# Patient Record
Sex: Female | Born: 1975 | Race: Black or African American | Hispanic: No | Marital: Single | Smoking: Never smoker
Health system: Southern US, Community
[De-identification: ages and names within clinical notes are randomized; demographics above are authoritative.]

## PROBLEM LIST (undated history)

## (undated) DIAGNOSIS — K5792 Diverticulitis of intestine, part unspecified, without perforation or abscess without bleeding: Secondary | ICD-10-CM

## (undated) HISTORY — DX: Diverticulitis of intestine, part unspecified, without perforation or abscess without bleeding: K57.92

---

## 1999-10-18 ENCOUNTER — Other Ambulatory Visit: Admission: RE | Admit: 1999-10-18 | Discharge: 1999-10-18 | Payer: Self-pay | Admitting: Internal Medicine

## 1999-12-24 HISTORY — PX: FOOT SURGERY: SHX648

## 2002-01-06 ENCOUNTER — Other Ambulatory Visit: Admission: RE | Admit: 2002-01-06 | Discharge: 2002-01-06 | Payer: Self-pay | Admitting: Obstetrics and Gynecology

## 2003-09-08 ENCOUNTER — Other Ambulatory Visit: Admission: RE | Admit: 2003-09-08 | Discharge: 2003-09-08 | Payer: Self-pay | Admitting: Obstetrics and Gynecology

## 2007-04-01 ENCOUNTER — Emergency Department (HOSPITAL_COMMUNITY): Admission: EM | Admit: 2007-04-01 | Discharge: 2007-04-02 | Payer: Self-pay | Admitting: Emergency Medicine

## 2007-07-22 ENCOUNTER — Encounter: Admission: RE | Admit: 2007-07-22 | Discharge: 2007-07-22 | Payer: Self-pay | Admitting: General Surgery

## 2007-08-03 ENCOUNTER — Encounter: Admission: RE | Admit: 2007-08-03 | Discharge: 2007-08-03 | Payer: Self-pay | Admitting: Internal Medicine

## 2007-10-10 ENCOUNTER — Emergency Department (HOSPITAL_COMMUNITY): Admission: EM | Admit: 2007-10-10 | Discharge: 2007-10-11 | Payer: Self-pay | Admitting: Emergency Medicine

## 2010-12-04 ENCOUNTER — Emergency Department (HOSPITAL_COMMUNITY)
Admission: EM | Admit: 2010-12-04 | Discharge: 2010-12-05 | Payer: Self-pay | Source: Home / Self Care | Admitting: Emergency Medicine

## 2011-01-10 ENCOUNTER — Other Ambulatory Visit (HOSPITAL_COMMUNITY): Payer: Self-pay | Admitting: General Surgery

## 2011-01-10 DIAGNOSIS — K5792 Diverticulitis of intestine, part unspecified, without perforation or abscess without bleeding: Secondary | ICD-10-CM

## 2011-01-11 ENCOUNTER — Inpatient Hospital Stay (HOSPITAL_COMMUNITY): Admission: RE | Admit: 2011-01-11 | Payer: Self-pay | Source: Ambulatory Visit

## 2011-01-13 ENCOUNTER — Encounter: Payer: Self-pay | Admitting: Internal Medicine

## 2011-02-07 ENCOUNTER — Other Ambulatory Visit (HOSPITAL_COMMUNITY): Payer: Self-pay

## 2011-03-05 LAB — URINALYSIS, ROUTINE W REFLEX MICROSCOPIC
Nitrite: NEGATIVE
Protein, ur: NEGATIVE mg/dL
Specific Gravity, Urine: 1.022 (ref 1.005–1.030)
Urobilinogen, UA: 0.2 mg/dL (ref 0.0–1.0)

## 2011-03-05 LAB — CBC
HCT: 37.4 % (ref 36.0–46.0)
Hemoglobin: 12.4 g/dL (ref 12.0–15.0)
MCH: 27.1 pg (ref 26.0–34.0)
MCHC: 33.2 g/dL (ref 30.0–36.0)
RBC: 4.58 MIL/uL (ref 3.87–5.11)

## 2011-03-05 LAB — URINE MICROSCOPIC-ADD ON

## 2011-03-05 LAB — CLOSTRIDIUM DIFFICILE BY PCR

## 2011-03-05 LAB — DIFFERENTIAL
Basophils Relative: 0 % (ref 0–1)
Eosinophils Absolute: 0.2 10*3/uL (ref 0.0–0.7)
Lymphs Abs: 2.4 10*3/uL (ref 0.7–4.0)
Monocytes Absolute: 0.4 10*3/uL (ref 0.1–1.0)
Monocytes Relative: 6 % (ref 3–12)
Neutrophils Relative %: 59 % (ref 43–77)

## 2011-03-24 HISTORY — PX: DILATION AND CURETTAGE OF UTERUS: SHX78

## 2011-05-14 NOTE — H&P (Signed)
NAMERASHADA, KLONTZ NO.:  [id number]  MEDICAL RECORD NO.:  [id number]           Savage TYPE:  O  LOCATION:  [facility]                           F[facility]ILITY:  [facility]  PHYSICIAN:  Osborn Coho, M.D.   DATE OF BIRTH:  [birth year]  DATE OF ADMISSION:  05/06/2011 DATE OF DISCHARGE:                             HISTORY & PHYSICAL   HISTORY OF PRESENT ILLNESS:  Penny Savage is a 35 year old single black female gravida 0 presenting for hysteroscopy D and C because of menorrhagia and an endometrial mass.  Penny Savage reports that Penny Savage entire life Penny Savage has had what Penny Savage considered "heavy menstrual periods." However, more recently, Penny Savage has developed during Penny course of Penny Savage 5-day menstrual flow, Penny need to use two pads at a time, which Penny Savage changes every hour and an half.  In addition, Penny Savage will pass considerably large clots (grape sized) and endure 10/10 on a 10-point pain scale cramping. Penny Savage finds some relief from Penny Savage menstrual cramping by taking ibuprofen 800 mg.  Penny Savage denies any intermenstrual bleeding, urinary tract symptoms, changes in Penny Savage bowel movements, or vaginitis symptoms.  A sonohysterogram along with an ultrasound done in April 2012 showed a uterus measuring 7.65 x 4.72 x 4.01 cm and an endometrial mass measuring 0.77 x 0.75 x 2.41 cm.  Penny Savage's ovaries appeared normal  bilaterally on that study.  Given Penny Savage persistent symptoms along with  sonographic findings, Penny Savage has decided that Penny Savage wanted to proceed  with surgical management in Penny form of hysteroscopy D and C.  PAST MEDICAL HISTORY:  OBSTETRICAL HISTORY:  Gravida 0.  GYNECOLOGIC HISTORY:  Menarche 35 years old.  Last menstrual period May 09, 2011.  Penny Savage uses abstinence as Penny Savage method of contraception. Penny Savage denies any sexually transmitted diseases or abnormal Pap smears. Penny Savage last normal Pap smear was in 2012.  MEDICAL HISTORY:  Diverticulitis and right clavicle fracture.  SURGICAL  HISTORY:  In 2004, Penny Savage had surgery on both of Penny Savage small toes.  Penny Savage denies any history of blood transfusions, but does admit to difficulty with breathing when being awakened from anesthesia.  FAMILY HISTORY:  Diabetes, hypertension, thyroid disease, cardiovascular disease, breast cancer (paternal grandmother in Penny Savage 61s and maternal aunt at age 73), migraines, and strokes.  SOCIAL HISTORY:  Penny Savage is single and Penny Savage works for [employer].  HABITS:  Penny Savage does not use tobacco or illicit drugs.  Penny Savage rarely consumes alcohol.  CURRENT MEDICATIONS:  Multivitamin daily.  Ibuprofen 800 mg every 8 hours as needed for menstrual cramping.  Penny Savage is allergic to PENICILLIN, which causes hives.  Denies any sensitivities to soy, shellfish, peanuts, or latex.  REVIEW OF SYSTEMS:  Penny Savage denies any chest pain, shortness of breath, headache, vision changes, nausea, vomiting, diarrhea, dysphagia, arthralgias, myalgias, night sweats, skin lesions, and except as mentioned in history of present illness.  Penny Savage's review of systems is otherwise negative.  PHYSICAL EXAMINATION:  VITAL SIGNS: Blood pressure is 122/80, pulse is 82, respirations 16, temperature 98.4 degrees Fahrenheit orally, weight 308 pounds, height 5 feet 5-1/2 inches tall.  Body  mass index 49.7. NECK:  Supple without masses.  There is no thyromegaly or cervical adenopathy. HEART:  Regular rate and rhythm. LUNGS:  Clear. BACK:  No CVA tenderness. ABDOMEN:  No tenderness, guarding, rebound, masses, or organomegaly. EXTREMITIES:  No clubbing, cyanosis, or edema. PELVIC:  EG, BUS is normal.  Vagina is normal.  There is blood in Penny vaginal vault.  Cervix is nontender without lesions.  Uterus appears normal size, shape, and consistency without tenderness, though exam is limited by body habitus.  Adnexa without tenderness or masses.  IMPRESSION: 1. Menorrhagia. 2. Endometrial mass.  DISPOSITION:  A discussion was held with  Penny Savage regarding Penny indications for Penny Savage procedures along with their risks, which include but are not limited to reaction to anesthesia, damage to adjacent organs, infection, and excessive bleeding.  Penny Savage was previously given an [employer] brochure on hysteroscopy.  Penny Savage verbalized understanding of Penny Savage risk and wishes to proceed with hysteroscopy with D and C at [facility] on May 16, 2011, at 9:30 a.m.     Elmira J. Lowell Guitar, P.A.-C   ______________________________ Osborn Coho, M.D.    EJP/MEDQ  D:  05/13/2011  T:  05/14/2011  Job:  [id number]  Electronically Signed by Raylene Everts. on 05/14/2011 11:33:23 AM Electronically Signed by Osborn Coho M.D. on 05/14/2011 03:33:21 PM

## 2011-05-16 ENCOUNTER — Other Ambulatory Visit: Payer: Self-pay | Admitting: Obstetrics and Gynecology

## 2011-05-16 ENCOUNTER — Ambulatory Visit (HOSPITAL_COMMUNITY)
Admission: RE | Admit: 2011-05-16 | Discharge: 2011-05-16 | Disposition: A | Discharge status: Home / Self Care | Payer: 59 | Source: Ambulatory Visit | Attending: Obstetrics and Gynecology | Admitting: Obstetrics and Gynecology

## 2011-05-16 DIAGNOSIS — N840 Polyp of corpus uteri: Secondary | ICD-10-CM | POA: Insufficient documentation

## 2011-05-16 DIAGNOSIS — N920 Excessive and frequent menstruation with regular cycle: Secondary | ICD-10-CM | POA: Insufficient documentation

## 2011-05-16 LAB — CBC
Hemoglobin: 12.8 g/dL (ref 12.0–15.0)
MCH: 27.3 pg (ref 26.0–34.0)
MCHC: 33.3 g/dL (ref 30.0–36.0)
MCV: 81.9 fL (ref 78.0–100.0)
Platelets: 305 10*3/uL (ref 150–400)
RBC: 4.69 MIL/uL (ref 3.87–5.11)

## 2011-05-21 NOTE — Op Note (Signed)
  NAMECHRISSY, EALEY                ACCOUNT NO.:  [id number]  MEDICAL RECORD NO.:  [id number]           PATIENT TYPE:  O  LOCATION:  [facility]                          [facility]ACILITY:  [facility]  PHYSICIAN:  Osborn Coho, M.D.   DATE OF BIRTH:  09/09/1976  DATE OF PROCEDURE:  05/16/2011 DATE OF DISCHARGE:                              OPERATIVE REPORT   PREOPERATIVE DIAGNOSES: 1. Menorrhagia 2. Endometrial mass.  POSTOPERATIVE DIAGNOSES: 1. Menorrhagia 2. Endometrial mass.  PROCEDURE: 1. Hysteroscopy. 2. Dilation and curettage.  ATTENDING:  Dr. Osborn Coho.  ANESTHESIA:  General via LMA.  FINDINGS:  Small polyp on the posterior wall of the uterus.  SPECIMENS TO PATHOLOGY:  Endometrial curettings.  HYSTEROSCOPIC FLUID DEFICIT:  Glycine 20 mL.  ESTIMATED BLOOD LOSS:  Minimal.  COMPLICATIONS:  None.  PROCEDURE:  The patient was taken to the operating room after risks, benefits and alternatives were discussed with the patient.  The patient verbalized understanding, consent signed and witnessed.  The patient was placed under general anesthesia and prepped and draped in the normal sterile fashion.  A paracervical block was administered using a total of 10 mL of 1% lidocaine and the cervix was dilated for passage of the hysteroscope.  The hysteroscope was introduced and findings as noted above.  Curettage was performed until a gritty texture was noted and polypoid appearing pieces of tissue were removed.  Hysteroscope was introduced once again and no obvious intracavitary lesions were noted.  All instruments were removed.  There was good hemostasis at tenaculum site.  Count was correct.  The patient tolerated procedure well and was waiting to be transferred to the recovery room in good condition.     Osborn Coho, M.D.     AR/MEDQ  D:  05/16/2011  T:  05/17/2011  Job:  [id number]  Electronically Signed by Osborn Coho M.D. on 05/21/2011 03:01:49 PM

## 2011-09-30 ENCOUNTER — Other Ambulatory Visit (INDEPENDENT_AMBULATORY_CARE_PROVIDER_SITE_OTHER): Payer: Self-pay | Admitting: General Surgery

## 2011-09-30 DIAGNOSIS — K5792 Diverticulitis of intestine, part unspecified, without perforation or abscess without bleeding: Secondary | ICD-10-CM

## 2011-10-02 ENCOUNTER — Ambulatory Visit (HOSPITAL_COMMUNITY)
Admission: RE | Admit: 2011-10-02 | Discharge: 2011-10-02 | Disposition: A | Discharge status: Home / Self Care | Payer: 59 | Source: Ambulatory Visit | Attending: General Surgery | Admitting: General Surgery

## 2011-10-02 DIAGNOSIS — K5792 Diverticulitis of intestine, part unspecified, without perforation or abscess without bleeding: Secondary | ICD-10-CM

## 2011-10-02 DIAGNOSIS — K5730 Diverticulosis of large intestine without perforation or abscess without bleeding: Secondary | ICD-10-CM | POA: Insufficient documentation

## 2011-10-02 LAB — DIFFERENTIAL
Basophils Absolute: 0
Basophils Relative: 0
Eosinophils Absolute: 0
Eosinophils Relative: 1

## 2011-10-02 LAB — URINALYSIS, ROUTINE W REFLEX MICROSCOPIC
Bilirubin Urine: NEGATIVE
Ketones, ur: 15 — AB
Specific Gravity, Urine: 1.019
pH: 5.5

## 2011-10-02 LAB — CBC
HCT: 37.5
MCHC: 34.1
MCV: 82.5
Platelets: 313
RDW: 13.3

## 2011-10-02 LAB — URINE MICROSCOPIC-ADD ON

## 2011-10-02 LAB — POCT PREGNANCY, URINE
Operator id: 294511
Preg Test, Ur: NEGATIVE

## 2011-10-02 LAB — I-STAT 8, (EC8 V) (CONVERTED LAB)
Bicarbonate: 24.8 — ABNORMAL HIGH
Glucose, Bld: 99
Potassium: 3.8
TCO2: 26
pCO2, Ven: 43.1 — ABNORMAL LOW
pH, Ven: 7.369 — ABNORMAL HIGH

## 2011-10-02 LAB — GC/CHLAMYDIA PROBE AMP, GENITAL: GC Probe Amp, Genital: NEGATIVE

## 2011-10-02 LAB — POCT I-STAT CREATININE: Operator id: 294511

## 2011-10-03 ENCOUNTER — Telehealth (INDEPENDENT_AMBULATORY_CARE_PROVIDER_SITE_OTHER): Payer: Self-pay

## 2011-10-03 NOTE — Telephone Encounter (Signed)
Patient called re: barium enema study that was done on scheduled date. Advised patient that order was still good for year- she may call to reschedule. RMP

## 2011-10-10 ENCOUNTER — Encounter (INDEPENDENT_AMBULATORY_CARE_PROVIDER_SITE_OTHER): Payer: Self-pay | Admitting: General Surgery

## 2011-10-10 ENCOUNTER — Ambulatory Visit (INDEPENDENT_AMBULATORY_CARE_PROVIDER_SITE_OTHER): Payer: 59 | Admitting: General Surgery

## 2011-10-10 VITALS — BP 118/82 | HR 68 | Temp 97.8°F | Resp 20 | Ht 65.5 in | Wt 314.6 lb

## 2011-10-10 DIAGNOSIS — E669 Obesity, unspecified: Secondary | ICD-10-CM

## 2011-10-10 DIAGNOSIS — K5730 Diverticulosis of large intestine without perforation or abscess without bleeding: Secondary | ICD-10-CM

## 2011-10-10 NOTE — Patient Instructions (Signed)
Please consider joining while some exercise program and spend the time that she now have not needed on your school work exercise and in trying to control your weight problem. There's been no worse than the diverticulosis on your recent x-ray study and I would not recommend any type of surgical treatments at this time. Hopefully he could lose maybe 50 plans in the next 6 months but 25 would be better than not trying. If you are having problems with diverticulitis please call and see one of my partners as I will be retiring at the end of December. Good luck.

## 2011-10-10 NOTE — Progress Notes (Signed)
Subjective:     Patient ID: Penny Savage, female   DOB: 09/08/1976, 35 y.o.   MRN: [id number]  HPIThe patient is a 35 year old female 305 lbs who has had previous problems with diverticulitis and has chronic diverticulosis I first saw her in September of 08 and placed her on oral antibiotics and then I next saw her in December of 2011 when she had another episode of diverticulitis and again treated her with Cipro and Flagyl. She works at [employer] that she's been in school chronic complete her degree and then the question was whether she would need elective colectomy on whether she can continue with medical management which she has been doing and is now 10 months later she denies father episodes of acute diverticulitis during this interval and has completed her school studies she was achieving.  She has had a recent full column barium enema which shows extensive diverticulosis of the complete left colon 3 scattered tics on the transverse and right colon but no evidence of any stricture or any acute inflammation. She says she's keep her stools regular with diet and really appears to be doing well. She has been approximately 5 or 8 pounds over the last year and I would definitely recommend that she joined the Y or sports time so that hopefully she can now turned her attention to trying to manage her obesity which would greatly help the likelihood of her get an acute diverticulitis and possible need of surgery. Patient's mother is of similar size and I think she did have a sigmoid colectomy following a colostomy and hopefully this will not be necessary with. Penny Savage.  Review of Systems No current outpatient prescriptions on file.       Objective:   Physical Exam BP 118/82  Pulse 68  Temp(Src) 97.8 F (36.6 C) (Temporal)  Resp 20  Ht 5' 5.5" (1.664 m)  Wt 314 lb 9.6 oz (142.702 kg)  BMI 51.56 kg/m2 On physical exam Penny Savage has a very large abdomen but there is no tenderness in any area and I did not do a  rectal examination. She states that she is having a good bowel movement every morning has never a problem with constipation. She has a friend which hopefully she can encourage to exercise with her and her husband she can maybe lose 25-50 pounds over the next 6-8 months. She is aware him return in December and follow problems with her diverticulosis or diverticulitis occurs one of my partners will see her.     Assessment:     Chronic left colon diverticulosis with no evidence of diverticulitis flare up over the last 10 months. The patient has continued to gain a small amount white and is considering joining a support time or Y. and become serous about trying to do dietary management of her obesity. She is aware of our bariatric program but understands that the problem needs a good attempt at weight loss management before even considering possible bariatric surgery    Plan:     See [place] prn

## 2012-08-16 ENCOUNTER — Emergency Department (HOSPITAL_COMMUNITY)
Admission: EM | Admit: 2012-08-16 | Discharge: 2012-08-17 | Disposition: A | Discharge status: Home / Self Care | Payer: 59 | Attending: Emergency Medicine | Admitting: Emergency Medicine

## 2012-08-16 ENCOUNTER — Encounter (HOSPITAL_COMMUNITY): Payer: Self-pay | Admitting: Emergency Medicine

## 2012-08-16 DIAGNOSIS — J069 Acute upper respiratory infection, unspecified: Secondary | ICD-10-CM | POA: Insufficient documentation

## 2012-08-16 NOTE — ED Provider Notes (Signed)
History     CSN: [id number]  Arrival date & time 08/16/12  2008   First MD Initiated Contact with Patient 08/16/12 2254      Chief Complaint  Patient presents with  . Sore Throat    (Consider location/radiation/quality/duration/timing/severity/associated sxs/prior treatment) HPI  36 year old female presents c/o cold sxs.  Sts for the past week she has been having runny nose, sore throat, dry cough and occasional sneezes.  C/o nasal congestion and nasal drainage to back of throat.  Denies fever but endorse chills.  Denies ear pain, voice changes, cp, sob, abd pain, n/v/d, or rash.  Has tried OTC robitussin without relief.  No recent sick contact.  No recent travel, no exposure to tick.    Past Medical History  Diagnosis Date  . Diverticulitis     Past Surgical History  Procedure Date  . Foot surgery 2001  . Dilation and curettage of uterus april 2012    Family History  Problem Relation Age of Onset  . Cancer Maternal Aunt     breast  . Cancer Paternal Grandmother     breast    History  Substance Use Topics  . Smoking status: Never Smoker   . Smokeless tobacco: Not on file  . Alcohol Use: No    OB History    Grav Para Term Preterm Abortions TAB SAB Ect Mult Living                  Review of Systems  All other systems reviewed and are negative.    Allergies  Penicillins  Home Medications   Current Outpatient Rx  Name Route Sig Dispense Refill  . GUAIFENESIN 100 MG/5ML PO LIQD Oral Take 200 mg by mouth 3 (three) times daily as needed. For cough      BP 138/84  Pulse 93  Temp 98.2 F (36.8 C) (Oral)  Resp 20  SpO2 100%  Physical Exam  Nursing note and vitals reviewed. Constitutional: She appears well-developed and well-nourished. No distress.       Awake, alert, nontoxic appearance  HENT:  Head: Normocephalic and atraumatic.  Right Ear: External ear normal.  Left Ear: External ear normal.  Nose: Rhinorrhea present.  Mouth/Throat: Uvula is  midline, oropharynx is clear and moist and mucous membranes are normal. No oropharyngeal exudate.       Mild post oropharyngeal edema without tonsilar enlargement, exudates, or PTA.  Eyes: Conjunctivae are normal. Right eye exhibits no discharge. Left eye exhibits no discharge.  Neck: Normal range of motion. Neck supple.  Cardiovascular: Normal rate and regular rhythm.   Pulmonary/Chest: Effort normal. No respiratory distress. She exhibits no tenderness.  Abdominal: Soft. There is no tenderness. There is no rebound.  Musculoskeletal: She exhibits no tenderness.       ROM appears intact, no obvious focal weakness  Lymphadenopathy:    She has no cervical adenopathy.  Neurological:       Mental status and motor strength appears intact  Skin: No rash noted.  Psychiatric: She has a normal mood and affect.    ED Course  Procedures (including critical care time)  Labs Reviewed - No data to display No results found.   No diagnosis found.  Results for orders placed during the hospital encounter of 08/16/12  RAPID STREP SCREEN      Component Value Range   Streptococcus, Group A Screen (Direct) NEGATIVE  NEGATIVE   No results found.  1. URI  MDM  Pt presents with  cold sxs.  Is afebrile, normal voice, no EMC.    12:03 AM Strep test negative.  Care instruction given.  Will d/c.        Fayrene Helper, PA-C 08/17/12 0004

## 2012-08-16 NOTE — ED Notes (Signed)
Pt alert, arrives from home, c/o cough and sore throat, onset was weeks ago, resp even unlabored, skin pwd, denies recent ill exposures or contact

## 2012-08-17 MED ORDER — HYDROCODONE-ACETAMINOPHEN 7.5-500 MG/15ML PO SOLN: 15.0000 mL | Freq: Four times a day (QID) | ORAL | Status: AC | PRN

## 2012-08-19 NOTE — ED Provider Notes (Signed)
Medical screening examination/treatment/procedure(s) were performed by non-physician practitioner and as supervising physician I was immediately available for consultation/collaboration.  John-Adam Darlyne Schmiesing, M.D.     John-Adam Peyton Spengler, MD 08/19/12 1323 

## 2013-08-28 IMAGING — CR DG BE W/ CM - WO/W KUB
7 series · 7 of 7 positions shown · non-contrast
Comparison: CT abdomen and pelvis 12/05/2010 and earlier.

CLINICAL DATA: 35-year-old female with history diverticulosis and
diverticulitis.

SINGLE CONTRAST BARIUM ENEMA
Fluoroscopy time of 2.6 minutes was utilized.

[view not recorded (1 of 7)]
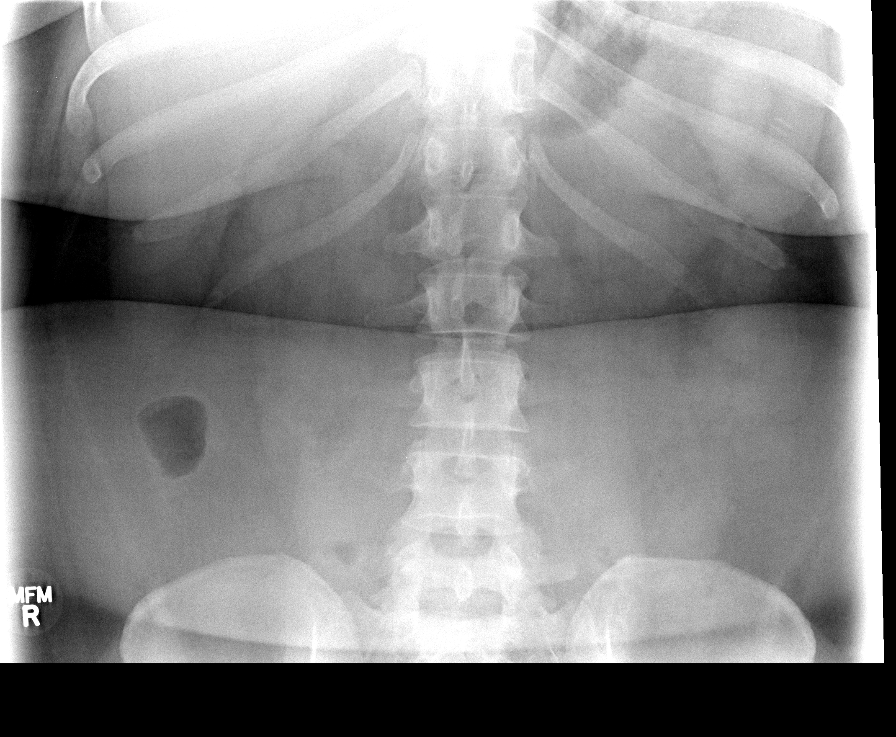

[view not recorded (2 of 7)]
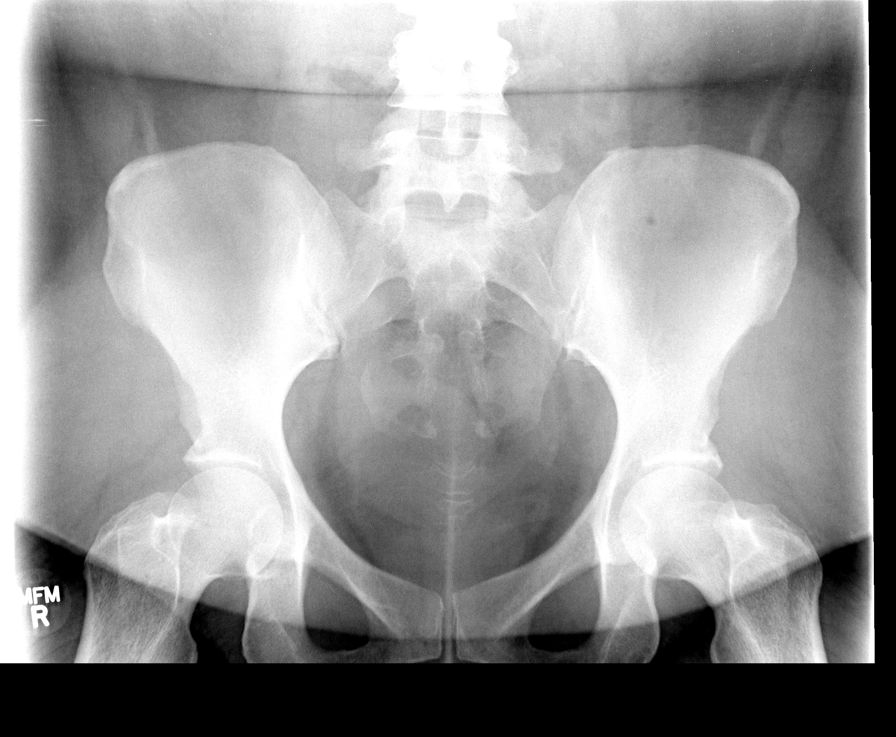

[view not recorded (3 of 7)]
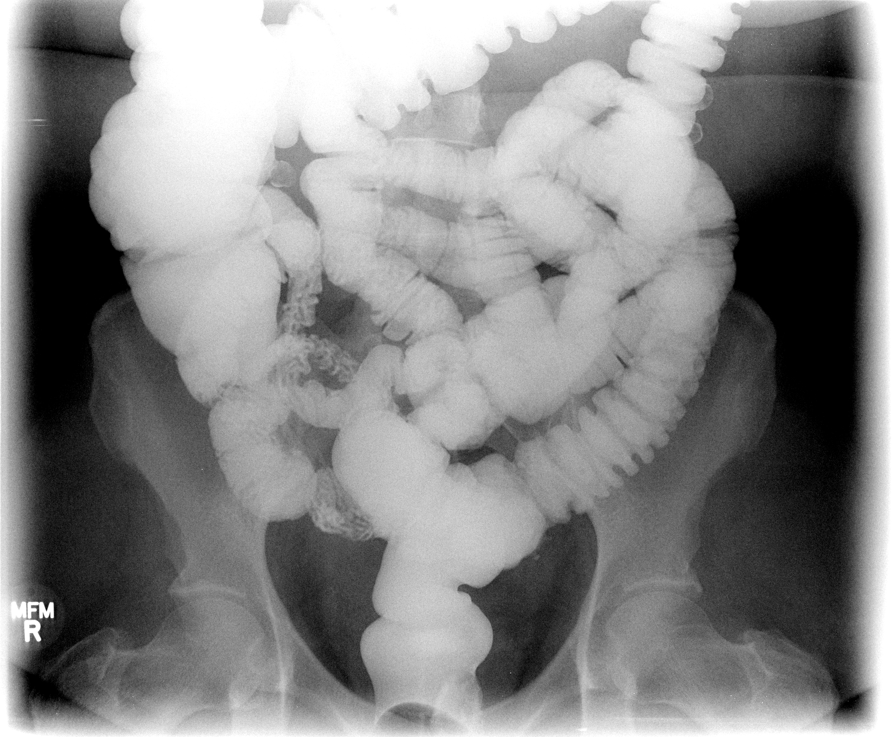

[view not recorded (4 of 7)]
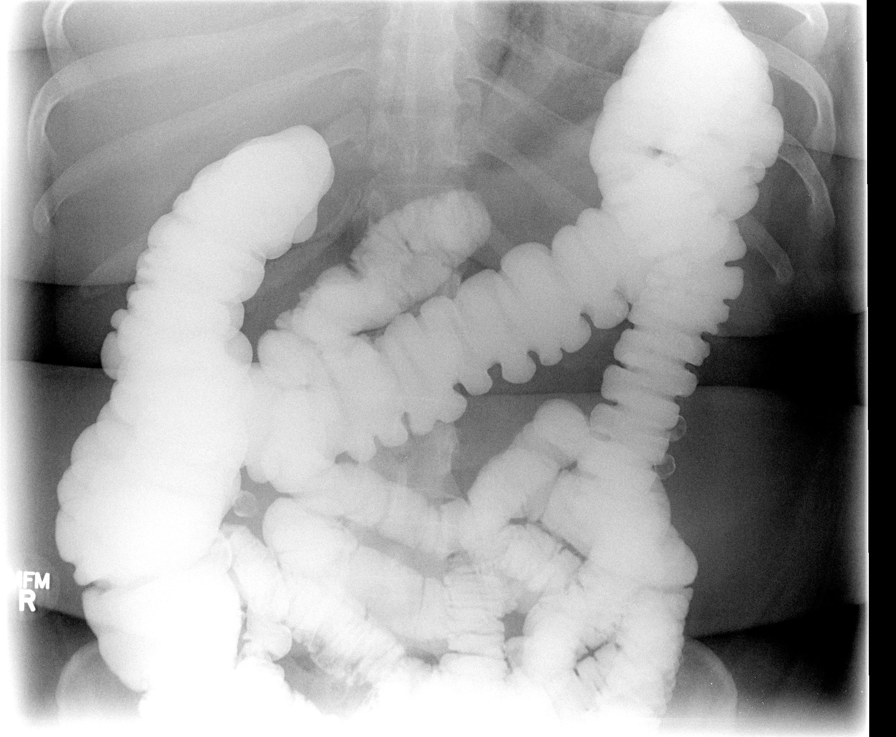

[view not recorded (5 of 7)]
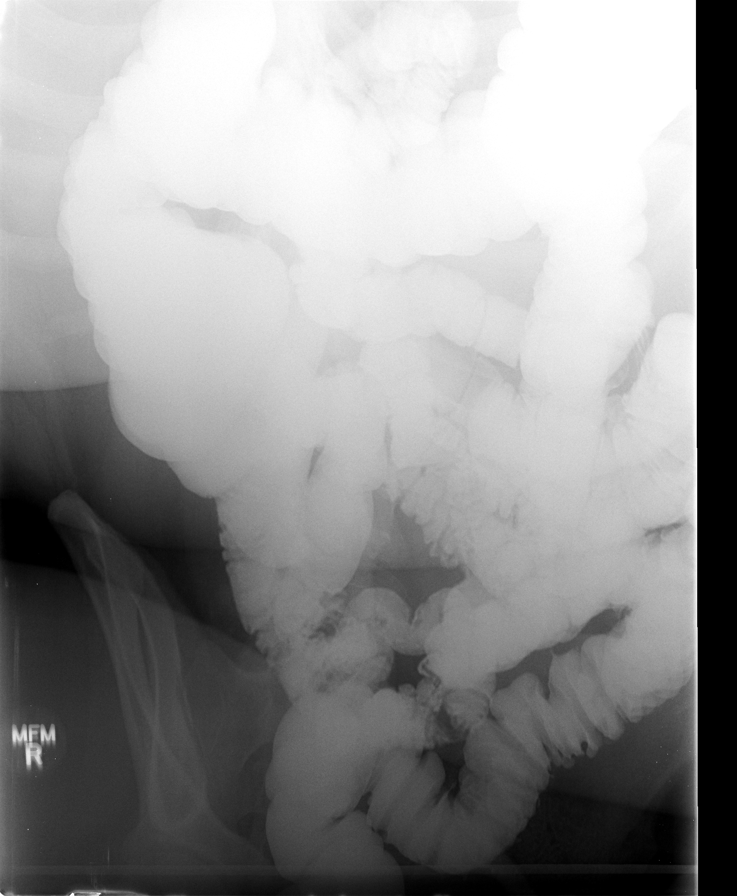

[view not recorded (6 of 7)]
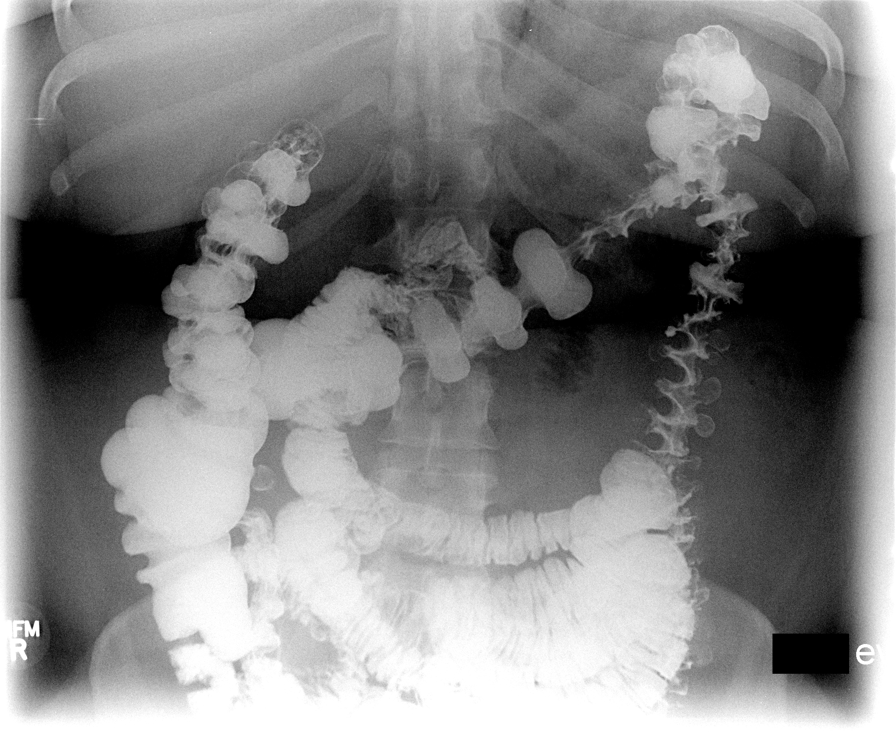

[view not recorded (7 of 7)]
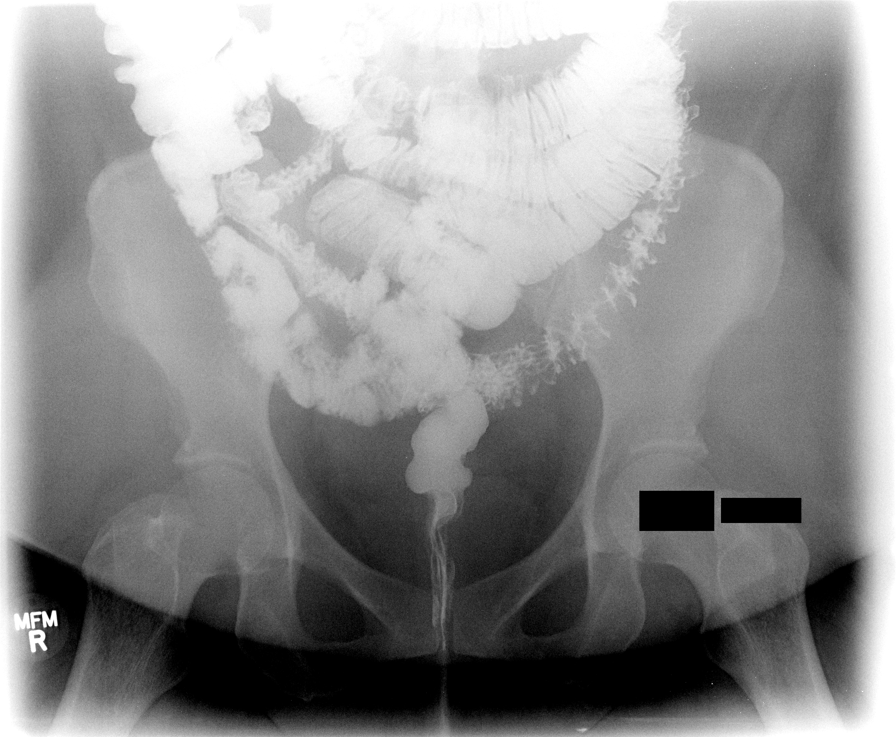

[7 of 7 positions shown; findings below may reference images not displayed]

FINDINGS: Preprocedural scout view of the abdomen demonstrates a
paucity of bowel gas and no retained stool.  Abdominal and pelvic
visceral contours are within normal limits. No osseous abnormality
identified.

Thin barium contrast was instilled via gravity.  No obstruction to
the retrograde flow of contrast throughout the colon, and
ultimately numerous small bowel loops were opacified.

Extensive diverticulosis of the sigmoid colon.  Smaller diverticula
in the distal sigmoid and larger diverticula in the proximal
sigmoid (series 12).
Extensive diverticulosis of the descending colon (series 14).
Diverticula extending to the splenic flexure (series 17).
Transverse colon is somewhat spared, larger left numerous
diverticula occasionally seen.
Hepatic flexure is spared.
The right colon is relatively spared, there is a solitary prominent
diverticulum directed medially from the ascending colon (overhead
film one).
No diverticula at the cecum.
Relatively good evacuation of contrast on the postevacuation.
Visualized small bowel loops within normal limits.
IMPRESSION: 1.  Widespread diverticulosis from the splenic flexure to the
distal sigmoid.
2.  Occasional diverticula in the transverse colon and ascending
colon. Sparing of the hepatic flexure and cecum.

## 2013-09-22 ENCOUNTER — Emergency Department (HOSPITAL_COMMUNITY)
Admission: EM | Admit: 2013-09-22 | Discharge: 2013-09-22 | Disposition: A | Discharge status: Home / Self Care | Payer: 59 | Attending: Emergency Medicine | Admitting: Emergency Medicine

## 2013-09-22 ENCOUNTER — Emergency Department (HOSPITAL_COMMUNITY): Payer: 59

## 2013-09-22 ENCOUNTER — Encounter (HOSPITAL_COMMUNITY): Payer: Self-pay | Admitting: Emergency Medicine

## 2013-09-22 DIAGNOSIS — Z880 Allergy status to penicillin: Secondary | ICD-10-CM | POA: Insufficient documentation

## 2013-09-22 DIAGNOSIS — K5792 Diverticulitis of intestine, part unspecified, without perforation or abscess without bleeding: Secondary | ICD-10-CM

## 2013-09-22 DIAGNOSIS — Z3202 Encounter for pregnancy test, result negative: Secondary | ICD-10-CM | POA: Insufficient documentation

## 2013-09-22 DIAGNOSIS — K5732 Diverticulitis of large intestine without perforation or abscess without bleeding: Secondary | ICD-10-CM | POA: Insufficient documentation

## 2013-09-22 DIAGNOSIS — Z9889 Other specified postprocedural states: Secondary | ICD-10-CM | POA: Insufficient documentation

## 2013-09-22 DIAGNOSIS — E669 Obesity, unspecified: Secondary | ICD-10-CM | POA: Insufficient documentation

## 2013-09-22 LAB — URINALYSIS, ROUTINE W REFLEX MICROSCOPIC
Bilirubin Urine: NEGATIVE
Glucose, UA: NEGATIVE mg/dL
Hgb urine dipstick: NEGATIVE
Ketones, ur: NEGATIVE mg/dL
pH: 6.5 (ref 5.0–8.0)

## 2013-09-22 LAB — COMPREHENSIVE METABOLIC PANEL
AST: 16 U/L (ref 0–37)
Albumin: 3.7 g/dL (ref 3.5–5.2)
Alkaline Phosphatase: 76 U/L (ref 39–117)
BUN: 10 mg/dL (ref 6–23)
Chloride: 100 mEq/L (ref 96–112)
Potassium: 4 mEq/L (ref 3.5–5.1)
Total Bilirubin: 0.2 mg/dL — ABNORMAL LOW (ref 0.3–1.2)

## 2013-09-22 LAB — CBC WITH DIFFERENTIAL/PLATELET
Basophils Absolute: 0 10*3/uL (ref 0.0–0.1)
Basophils Relative: 0 % (ref 0–1)
Hemoglobin: 13.6 g/dL (ref 12.0–15.0)
MCHC: 33.9 g/dL (ref 30.0–36.0)
Monocytes Relative: 6 % (ref 3–12)
Neutro Abs: 5.9 10*3/uL (ref 1.7–7.7)
Neutrophils Relative %: 72 % (ref 43–77)

## 2013-09-22 MED ORDER — ONDANSETRON HCL 4 MG/2ML IJ SOLN: 4.0000 mg | Freq: Once | INTRAMUSCULAR | Status: DC

## 2013-09-22 MED ORDER — IOHEXOL 300 MG/ML  SOLN
50.0000 mL | Freq: Once | INTRAMUSCULAR | Status: AC | PRN
  Administered 2013-09-22: 50 mL via ORAL

## 2013-09-22 MED ORDER — OXYCODONE-ACETAMINOPHEN 5-325 MG PO TABS
2.0000 | ORAL_TABLET | Freq: Once | ORAL | Status: DC
  Filled 2013-09-22: qty 2

## 2013-09-22 MED ORDER — METRONIDAZOLE 500 MG PO TABS
500.0000 mg | ORAL_TABLET | Freq: Once | ORAL | Status: AC
  Administered 2013-09-22: 500 mg via ORAL
  Filled 2013-09-22: qty 1

## 2013-09-22 MED ORDER — MORPHINE SULFATE 4 MG/ML IJ SOLN: 4.0000 mg | Freq: Once | INTRAMUSCULAR | Status: DC

## 2013-09-22 MED ORDER — SODIUM CHLORIDE 0.9 % IV BOLUS (SEPSIS): 1000.0000 mL | Freq: Once | INTRAVENOUS | Status: DC

## 2013-09-22 MED ORDER — CIPROFLOXACIN HCL 500 MG PO TABS: 500.0000 mg | ORAL_TABLET | Freq: Two times a day (BID) | ORAL | Status: DC

## 2013-09-22 MED ORDER — METRONIDAZOLE 500 MG PO TABS: 500.0000 mg | ORAL_TABLET | Freq: Two times a day (BID) | ORAL | Status: DC

## 2013-09-22 MED ORDER — IOHEXOL 300 MG/ML  SOLN: 100.0000 mL | Freq: Once | INTRAMUSCULAR | Status: DC | PRN

## 2013-09-22 MED ORDER — OXYCODONE-ACETAMINOPHEN 5-325 MG PO TABS: ORAL_TABLET | ORAL | Status: DC

## 2013-09-22 MED ORDER — CIPROFLOXACIN HCL 500 MG PO TABS
500.0000 mg | ORAL_TABLET | Freq: Once | ORAL | Status: AC
  Administered 2013-09-22: 500 mg via ORAL
  Filled 2013-09-22: qty 1

## 2013-09-22 MED ORDER — ONDANSETRON 4 MG PO TBDP
4.0000 mg | ORAL_TABLET | Freq: Once | ORAL | Status: AC
  Administered 2013-09-22: 4 mg via ORAL
  Filled 2013-09-22: qty 1

## 2013-09-22 NOTE — ED Notes (Addendum)
Charge rn attempt at iv insertion unsuccessful. Pt refusing iv at present time. EDP and PA made aware and PA at beside present time.

## 2013-09-22 NOTE — ED Notes (Signed)
Pt made aware that iv team has been contacted to attempt iv insertion. Pt encouraged to void when able.

## 2013-09-22 NOTE — ED Notes (Signed)
IV insertion attempt x2 unsuccessful. Pt states had multiple sticks in triage. Will page IV team.

## 2013-09-22 NOTE — ED Notes (Signed)
Pt c/o abd pain, nausea, diarrhea since 2 am this morning.  States hx of diverticulitis.

## 2013-09-22 NOTE — ED Notes (Signed)
Pt states sudden abd pain this morning around 2 am. With nausea but denies vomiting. Pt reports x6 episodes of diarrhea. Pt states she has a hx of diverticulosis and that this pain is similar.

## 2013-09-22 NOTE — ED Provider Notes (Signed)
CSN: [id number]     Arrival date & time 09/22/13  1408 History   First MD Initiated Contact with Patient 09/22/13 1604     Chief Complaint  Patient presents with  . Abdominal Pain   (Consider location/radiation/quality/duration/timing/severity/associated sxs/prior Treatment) HPI  Penny Savage is a 37 y.o. female otherwise healthy with h/o diverticulitis c/o worsening LLQ pain, diarrhea and nausea onset at 2 AM. Pain is colicky aching and sharp,  now 6/10, no exacerbating or alleviating factors identified. States this feels like prior diverticulitis episode. Denies fever, melena, hematochezia, emesis, change in bladder habits, CP, SOB.   Past Medical History  Diagnosis Date  . Diverticulitis    Past Surgical History  Procedure Laterality Date  . Foot surgery  2001  . Dilation and curettage of uterus  april 2012   Family History  Problem Relation Age of Onset  . Cancer Maternal Aunt     breast  . Cancer Paternal Grandmother     breast   History  Substance Use Topics  . Smoking status: Never Smoker   . Smokeless tobacco: Not on file  . Alcohol Use: No   OB History   Grav Para Term Preterm Abortions TAB SAB Ect Mult Living                 Review of Systems 10 systems reviewed and found to be negative, except as noted in the HPI  Allergies  Penicillins  Home Medications   Current Outpatient Rx  Name  Route  Sig  Dispense  Refill  . guaiFENesin (ROBITUSSIN) 100 MG/5ML liquid   Oral   Take 200 mg by mouth 3 (three) times daily as needed. For cough          BP 168/87  Pulse 72  Temp(Src) 98 F (36.7 C) (Oral)  Resp 16  SpO2 100% Physical Exam  Nursing note and vitals reviewed. Constitutional: She is oriented to person, place, and time. She appears well-developed and well-nourished. No distress.  Obese   HENT:  Head: Normocephalic.  Mouth/Throat: Oropharynx is clear and moist.  Eyes: Conjunctivae and EOM are normal.  Cardiovascular: Normal rate and  regular rhythm.   Pulmonary/Chest: Effort normal and breath sounds normal. No stridor. No respiratory distress. She has no wheezes. She has no rales. She exhibits no tenderness.  Abdominal: Soft. She exhibits no distension and no mass. There is tenderness. There is no rebound and no guarding.  Moderate LLQ pain, no guarding or rebound  Musculoskeletal: Normal range of motion.  Neurological: She is alert and oriented to person, place, and time.  Psychiatric: She has a normal mood and affect.    ED Course  Procedures (including critical care time) Labs Review Labs Reviewed  COMPREHENSIVE METABOLIC PANEL - Abnormal; Notable for the following:    Sodium 134 (*)    Total Bilirubin 0.2 (*)    GFR calc non Af Amer 79 (*)    All other components within normal limits  LIPASE, BLOOD - Abnormal; Notable for the following:    Lipase 10 (*)    All other components within normal limits  CBC WITH DIFFERENTIAL  URINALYSIS, ROUTINE W REFLEX MICROSCOPIC  POCT PREGNANCY, URINE   Imaging Review Ct Abdomen Pelvis Wo Contrast  09/22/2013   CLINICAL DATA:  Lower abdominal pain  EXAM: CT ABDOMEN AND PELVIS WITHOUT CONTRAST  TECHNIQUE: Multidetector CT imaging of the abdomen and pelvis was performed following the standard protocol without intravenous contrast. Oral contrast was administered. Intravenous  contrast was not administered due to inability to secure venous access.  COMPARISON:  December 05, 2010  FINDINGS: Lung bases are clear.  The liver is enlarged, measuring 21.7 cm in length. No focal liver lesions are identified on this non contrast enhanced study. There is no biliary duct dilatation. Gallbladder wall does not appear appreciably thickened.  Spleen, pancreas, and adrenals appear normal.  Kidneys bilaterally show no appreciable mass, calculus, or hydronephrosis on either side. There is no ureteral calculus or ureterectasis on either side.  In the pelvis, there are multiple sigmoid diverticula. There  is subtle mesenteric thickening in the region of the proximal to mid sigmoid colon consistent with a mild degree of diverticulitis. There is no abscess. There is no pelvic mass or fluid collection. Appendix is not seen. There is no periappendiceal region abnormality.  There is no bowel obstruction. No free air or portal venous air.  There is no ascites, adenopathy, or abscess in the abdomen or pelvis. Aorta is nonaneurysmal. There are no blastic or lytic bone lesions.  IMPRESSION: Sigmoid diverticulitis without abscess.  No bowel obstruction.  No free air.  Liver enlarged but otherwise appears unremarkable on this nonintravenous contrast enhanced study.  Study otherwise unremarkable.   Electronically Signed   By: Bretta Bang   On: 09/22/2013 18:28    MDM   1. Diverticulitis      Filed Vitals:   09/22/13 1443 09/22/13 1708 09/22/13 1833  BP: 168/87  153/95  Pulse: 72  74  Temp: 98 F (36.7 C) 97.7 F (36.5 C) 98.5 F (36.9 C)  TempSrc: Oral  Oral  Resp: 16  18  SpO2: 100%  100%     Penny Savage is a 37 y.o. female with LLQ pain c/w prior episodes of diverticulitis. Pt is afebrile, abd exam is non surgical. No IV access could be established, several attempts, PT refuses further tries. I have discussed that this will limit the quality of the CT. Bloodwork, UA and vitals reassuring. CT shows diverticulitis with no abscess or perforation. We have discussed the limitations of the exam and return precations  Medications  iohexol (OMNIPAQUE) 300 MG/ML solution 50 mL (50 mLs Oral Contrast Given 09/22/13 1634)  ciprofloxacin (CIPRO) tablet 500 mg (500 mg Oral Given 09/22/13 1851)  metroNIDAZOLE (FLAGYL) tablet 500 mg (500 mg Oral Given 09/22/13 1850)  ondansetron (ZOFRAN-ODT) disintegrating tablet 4 mg (4 mg Oral Given 09/22/13 1832)    Pt is hemodynamically stable, appropriate for, and amenable to discharge at this time. Pt verbalized understanding and agrees with care plan. All questions  answered. Outpatient follow-up and specific return precautions discussed.    Discharge Medication List as of 09/22/2013  6:40 PM    START taking these medications   Details  ciprofloxacin (CIPRO) 500 MG tablet Take 1 tablet (500 mg total) by mouth every 12 (twelve) hours., Starting 09/22/2013, Until Discontinued, Print    metroNIDAZOLE (FLAGYL) 500 MG tablet Take 1 tablet (500 mg total) by mouth 2 (two) times daily. One tab PO bid x 10 days, Starting 09/22/2013, Until Discontinued, Print    oxyCODONE-acetaminophen (PERCOCET/ROXICET) 5-325 MG per tablet 1 to 2 tabs PO q6hrs  PRN for pain, Print        Note: Portions of this report may have been transcribed using voice recognition software. Every effort was made to ensure accuracy; however, inadvertent computerized transcription errors may be present      Wynetta Emery, PA-C 09/23/13 0119

## 2013-09-25 NOTE — ED Provider Notes (Signed)
Medical screening examination/treatment/procedure(s) were performed by non-physician practitioner and as supervising physician I was immediately available for consultation/collaboration.    Ahnna Dungan R Yosiah Jasmin, MD 09/25/13 0842 

## 2014-07-31 ENCOUNTER — Encounter (HOSPITAL_COMMUNITY): Payer: Self-pay | Admitting: Emergency Medicine

## 2014-07-31 ENCOUNTER — Emergency Department (HOSPITAL_COMMUNITY)
Admission: EM | Admit: 2014-07-31 | Discharge: 2014-07-31 | Disposition: A | Discharge status: Home / Self Care | Payer: 59 | Attending: Emergency Medicine | Admitting: Emergency Medicine

## 2014-07-31 DIAGNOSIS — Z880 Allergy status to penicillin: Secondary | ICD-10-CM | POA: Diagnosis not present

## 2014-07-31 DIAGNOSIS — K5732 Diverticulitis of large intestine without perforation or abscess without bleeding: Secondary | ICD-10-CM | POA: Diagnosis not present

## 2014-07-31 DIAGNOSIS — R1032 Left lower quadrant pain: Secondary | ICD-10-CM

## 2014-07-31 DIAGNOSIS — R109 Unspecified abdominal pain: Secondary | ICD-10-CM | POA: Insufficient documentation

## 2014-07-31 DIAGNOSIS — E669 Obesity, unspecified: Secondary | ICD-10-CM | POA: Diagnosis not present

## 2014-07-31 DIAGNOSIS — K5792 Diverticulitis of intestine, part unspecified, without perforation or abscess without bleeding: Secondary | ICD-10-CM

## 2014-07-31 MED ORDER — CIPROFLOXACIN HCL 500 MG PO TABS: 500.0000 mg | ORAL_TABLET | Freq: Two times a day (BID) | ORAL | Status: DC

## 2014-07-31 MED ORDER — METRONIDAZOLE 500 MG PO TABS: 500.0000 mg | ORAL_TABLET | Freq: Three times a day (TID) | ORAL | Status: DC

## 2014-07-31 MED ORDER — ONDANSETRON HCL 4 MG PO TABS: 4.0000 mg | ORAL_TABLET | Freq: Four times a day (QID) | ORAL | Status: DC

## 2014-07-31 NOTE — ED Provider Notes (Signed)
CSN: [id number]     Arrival date & time 07/31/14  0729 History   First MD Initiated Contact with Patient 07/31/14 [phone]     Chief Complaint  Patient presents with  . Abdominal Pain     (Consider location/radiation/quality/duration/timing/severity/associated sxs/prior Treatment) HPI  38 year old female with abdominal pain. Gradual onset yesterday. Pain is the upper quadrant. Does not radiate. Patient has a past history of diverticulitis. She reports that current symptoms feel similar. She reports that her stools have been looser, but not quite diarrhea. No blood. No urinary complaints. No unusual vaginal bleeding or discharge. She took some ibuprofen last night which seemed to help she is able to sleep through the night. This morning she had increasing pain which caused her to come to the emergency room. Mild nausea. No vomiting. No sick contacts. No fever or chills. Patient has well-documented diverticulosis and recurrent diverticulitis. She has been evaluated by surgery previously and discussed possible partial colectomy.  Past Medical History  Diagnosis Date  . Diverticulitis    Past Surgical History  Procedure Laterality Date  . Foot surgery  2001  . Dilation and curettage of uterus  april 2012   Family History  Problem Relation Age of Onset  . Cancer Maternal Aunt     breast  . Cancer Paternal Grandmother     breast   History  Substance Use Topics  . Smoking status: Never Smoker   . Smokeless tobacco: Not on file  . Alcohol Use: No   OB History   Grav Para Term Preterm Abortions TAB SAB Ect Mult Living                 Review of Systems  All systems reviewed and negative, other than as noted in HPI.   Allergies  Penicillins  Home Medications   Prior to Admission medications   Medication Sig Start Date End Date Taking? Authorizing Provider  ciprofloxacin (CIPRO) 500 MG tablet Take 1 tablet (500 mg total) by mouth every 12 (twelve) hours. 09/22/13   Nicole  Pisciotta, PA-C  ciprofloxacin (CIPRO) 500 MG tablet Take 1 tablet (500 mg total) by mouth every 12 (twelve) hours. 07/31/14   Raeford Razor, MD  Cyanocobalamin (VITAMIN B-12 SL) Place 0.5-1 mLs under the tongue as needed (energy).    Historical Provider, MD  metroNIDAZOLE (FLAGYL) 500 MG tablet Take 1 tablet (500 mg total) by mouth 2 (two) times daily. One tab PO bid x 10 days 09/22/13   Joni Reining Pisciotta, PA-C  metroNIDAZOLE (FLAGYL) 500 MG tablet Take 1 tablet (500 mg total) by mouth 3 (three) times daily. 07/31/14   Raeford Razor, MD  ondansetron (ZOFRAN) 4 MG tablet Take 1 tablet (4 mg total) by mouth every 6 (six) hours. 07/31/14   Raeford Razor, MD  oxyCODONE-acetaminophen (PERCOCET/ROXICET) 5-325 MG per tablet 1 to 2 tabs PO q6hrs  PRN for pain 09/22/13   Joni Reining Pisciotta, PA-C  Pseudoephedrine-Acetaminophen (TYLENOL SINUS MAX ST PO) Take 2 tablets by mouth as needed (sinus headache).    Historical Provider, MD   BP 154/73  Pulse 69  Temp(Src) 98.7 F (37.1 C) (Oral)  Resp 18  Ht 5\' 5"  (1.651 m)  SpO2 100%  LMP 07/30/2014 Physical Exam  Nursing note and vitals reviewed. Constitutional: She appears well-developed and well-nourished. No distress.  Sitting in bed. NAD. Obese.   HENT:  Head: Normocephalic and atraumatic.  Eyes: Conjunctivae are normal. Right eye exhibits no discharge. Left eye exhibits no discharge.  Neck: Neck supple.  Cardiovascular: Normal rate, regular rhythm and normal heart sounds.  Exam reveals no gallop and no friction rub.   No murmur heard. Pulmonary/Chest: Effort normal and breath sounds normal. No respiratory distress.  Abdominal: Soft. She exhibits no distension. There is tenderness. There is no rebound and no guarding.  LLQ tenderness w/o rebound or guarding  Genitourinary:  No cva tenderness  Musculoskeletal: She exhibits no edema and no tenderness.  Neurological: She is alert.  Skin: Skin is warm and dry.  Psychiatric: She has a normal mood and affect.  Her behavior is normal. Thought content normal.    ED Course  Procedures (including critical care time) Labs Review Labs Reviewed - No data to display  Imaging Review No results found.   EKG Interpretation None      MDM   Final diagnoses:  LLQ pain  Diverticulitis of intestine without perforation or abscess without bleeding    38yF with LLQ pain. Likely diverticulitis. Highly doubt perforation/abscess. Pt with well documented history of this and previous surgical evaluation for possible partial colectomy. Reports current symptoms very similar. No blood in stool. Tender in LLQ w/o rebound/guarding. No urinary symptoms. No vaginal bleeding/discharge. Discussed options with pt. I feel presumptive treatment and forgoing further work-up at this time is reasonable with the understanding that we could possibly be delaying diagnosis of alternative process. Pt is comfortable with this plan. I have a  Discussed return precautions. Cipro/Flagyl and PRN zofran. Pt offered pain medication but declining.     Raeford RazorStephen Cambelle Suchecki, MD 07/31/14 [phone]

## 2014-07-31 NOTE — ED Notes (Signed)
Pt c/o left lower quadrant abdominal pain onset early this morning. Pt reports nausea. Pt denies change in diet.

## 2014-07-31 NOTE — Discharge Instructions (Signed)

## 2016-04-02 ENCOUNTER — Ambulatory Visit (INDEPENDENT_AMBULATORY_CARE_PROVIDER_SITE_OTHER): Payer: 59 | Admitting: Podiatry

## 2016-04-02 ENCOUNTER — Encounter: Payer: Self-pay | Admitting: Podiatry

## 2016-04-02 VITALS — BP 160/100 | HR 81

## 2016-04-02 DIAGNOSIS — M21969 Unspecified acquired deformity of unspecified lower leg: Secondary | ICD-10-CM | POA: Insufficient documentation

## 2016-04-02 DIAGNOSIS — S93401A Sprain of unspecified ligament of right ankle, initial encounter: Secondary | ICD-10-CM

## 2016-04-02 DIAGNOSIS — M216X1 Other acquired deformities of right foot: Secondary | ICD-10-CM

## 2016-04-02 NOTE — Progress Notes (Signed)
Subjective; 40 year old female presents with pain in right ankle.  Stated that she twisted right ankle last January in ice, still hurts at times. Afternoons it start bothers her, 2-3 times a week. 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 Been to this office many years ago.  Objective: Neurovascular status are within normal. Dermatologic: No abnormal findings. Orthopedic: Forefoot varus with elevated first ray bilateral, tight Achilles tendon right 0 degree dorsiflexion from 90 degree ankle joint, dorsiflexion available about 5 degree on left. Presence of sharp palpable spur on dorsolateral calcaneus just anterior distal to fibular tip right ankle, symptomatic.  Assessment: Ankle equinus right. Elevated first ray bilateral. Cavus type foot. Bone spur right dorsolateral calcaneal bone, symptomatic. Hx. Of ankle sprain right.   Plan: Reviewed clinical findings and available treatment options. Reviewed stretch exercise for tight Achilles tendon, need for custom orthotics. Patient will return for custom orthotics.

## 2016-04-02 NOTE — Patient Instructions (Signed)
Seen for pain in right lateral ankle with history of ankle injury. Noted of tight Achilles tendon R>L, need daily stretch exercise. Noted of weak first metatarsal bone and lateral weight shifting. May benefit from custom orthotics.

## 2016-04-08 ENCOUNTER — Emergency Department (HOSPITAL_COMMUNITY)
Admission: EM | Admit: 2016-04-08 | Discharge: 2016-04-08 | Disposition: A | Discharge status: Home / Self Care | Payer: 59 | Attending: Emergency Medicine | Admitting: Emergency Medicine

## 2016-04-08 ENCOUNTER — Encounter (HOSPITAL_COMMUNITY): Payer: Self-pay | Admitting: Emergency Medicine

## 2016-04-08 DIAGNOSIS — IMO0001 Reserved for inherently not codable concepts without codable children: Secondary | ICD-10-CM

## 2016-04-08 DIAGNOSIS — Z880 Allergy status to penicillin: Secondary | ICD-10-CM | POA: Diagnosis not present

## 2016-04-08 DIAGNOSIS — R030 Elevated blood-pressure reading, without diagnosis of hypertension: Secondary | ICD-10-CM | POA: Diagnosis present

## 2016-04-08 LAB — I-STAT CHEM 8, ED
BUN: 9 mg/dL (ref 6–20)
CALCIUM ION: 1.24 mmol/L — AB (ref 1.12–1.23)
CREATININE: 1 mg/dL (ref 0.44–1.00)
Chloride: 102 mmol/L (ref 101–111)
GLUCOSE: 78 mg/dL (ref 65–99)
HCT: 39 % (ref 36.0–46.0)
Hemoglobin: 13.3 g/dL (ref 12.0–15.0)
Potassium: 3.7 mmol/L (ref 3.5–5.1)
Sodium: 141 mmol/L (ref 135–145)
TCO2: 26 mmol/L (ref 0–100)

## 2016-04-08 MED ORDER — HYDROCHLOROTHIAZIDE 25 MG PO TABS
12.0000 mg | ORAL_TABLET | Freq: Every day | ORAL | Status: DC
Start: 2016-04-08 — End: 2022-02-07

## 2016-04-08 NOTE — ED Notes (Signed)
Sent here from ENT dr with high blood pressure-- does not take any bp meds at present

## 2016-04-08 NOTE — ED Provider Notes (Signed)
CSN: [id number]     Arrival date & time 04/08/16  [id number] History   First MD Initiated Contact with Patient 04/08/16 1112     Chief Complaint  Patient presents with  . Hypertension     (Consider location/radiation/quality/duration/timing/severity/associated sxs/prior Treatment) HPI Patient went to your nose and throat doctor this morning for routine checkup and was told that she had elevated blood pressure. She comes here "to find out why my blood pressure is high". She is asymptomatic no shortness of breath no chest pain no headache no abdominal pain no treatment prior to coming here. No other associated symptoms. Is been told by her primary care physician Dr. Nila NephewEdwin Green that she has "borderline high blood pressure" several months ago Past Medical History  Diagnosis Date  . Diverticulitis    Past Surgical History  Procedure Laterality Date  . Foot surgery  2001  . Dilation and curettage of uterus  april 2012   Family History  Problem Relation Age of Onset  . Cancer Maternal Aunt     breast  . Cancer Paternal Grandmother     breast   Social History  Substance Use Topics  . Smoking status: Never Smoker   . Smokeless tobacco: Never Used  . Alcohol Use: No   OB History    No data available     Review of Systems  Constitutional: Negative.   HENT: Negative.   Respiratory: Negative.   Cardiovascular: Negative.   Gastrointestinal: Negative.   Musculoskeletal: Negative.   Skin: Negative.   Neurological: Negative.   Psychiatric/Behavioral: Negative.   All other systems reviewed and are negative.     Allergies  Penicillins  Home Medications   Prior to Admission medications   Not on File   BP 158/90 mmHg  Pulse 80  Temp(Src) 98.3 F (36.8 C) (Oral)  Resp 18  Ht 5\' 6"  (1.676 m)  Wt 315 lb (142.883 kg)  BMI 50.87 kg/m2  SpO2 100%  LMP 03/29/2016 (Exact Date) Physical Exam  Constitutional: She is oriented to person, place, and time. She appears well-developed  and well-nourished. No distress.  HENT:  Head: Normocephalic and atraumatic.  Eyes: Conjunctivae are normal. Pupils are equal, round, and reactive to light.  Neck: Neck supple. No tracheal deviation present. No thyromegaly present.  Cardiovascular: Normal rate and regular rhythm.   No murmur heard. Pulmonary/Chest: Effort normal and breath sounds normal.  Abdominal: Soft. Bowel sounds are normal. She exhibits no distension. There is no tenderness.  Obese  Musculoskeletal: Normal range of motion. She exhibits no edema or tenderness.  Neurological: She is alert and oriented to person, place, and time. Coordination normal.  Skin: Skin is warm and dry. No rash noted.  Psychiatric: She has a normal mood and affect.  Nursing note and vitals reviewed.   ED Course  Procedures (including critical care time) Labs Review Labs Reviewed - No data to display  Imaging Review No results found. I have personally reviewed and evaluated these images and lab results as part of my medical decision-making.   EKG Interpretation None     1:05 PM patient remains asymptomatic alert Glasgow Coma Score 15 Results for orders placed or performed during the hospital encounter of 04/08/16  I-stat chem 8, ed  Result Value Ref Range   Sodium 141 135 - 145 mmol/L   Potassium 3.7 3.5 - 5.1 mmol/L   Chloride 102 101 - 111 mmol/L   BUN 9 6 - 20 mg/dL   Creatinine, Ser 5.621.00 0.44 -  1.00 mg/dL   Glucose, Bld 78 65 - 99 mg/dL   Calcium, Ion 1.61 (H) 1.12 - 1.23 mmol/L   TCO2 26 0 - 100 mmol/L   Hemoglobin 13.3 12.0 - 15.0 g/dL   HCT 09.6 04.5 - 40.9 %   No results found.  MDM  Plan prescription and HCTZ. Blood pressure recheck within 3 weeks. She's made an appointment with her primary care physician for later this week Diagnosis elevated blood pressure Final diagnoses:  None        Doug Sou, MD 04/08/16 1313

## 2016-04-08 NOTE — Discharge Instructions (Signed)
Hypertension  Take the medication prescribed. Get your blood pressure rechecked within the next 3 weeks. Hypertension is another name for high blood pressure. High blood pressure forces your heart to work harder to pump blood. A blood pressure reading has two numbers, which includes a higher number over a lower number (example: 110/72). HOME CARE   Have your blood pressure rechecked by your doctor.  Only take medicine as told by your doctor. Follow the directions carefully. The medicine does not work as well if you skip doses. Skipping doses also puts you at risk for problems.  Do not smoke.  Monitor your blood pressure at home as told by your doctor. GET HELP IF:  You think you are having a reaction to the medicine you are taking.  You have repeat headaches or feel dizzy.  You have puffiness (swelling) in your ankles.  You have trouble with your vision. GET HELP RIGHT AWAY IF:   You get a very bad headache and are confused.  You feel weak, numb, or faint.  You get chest or belly (abdominal) pain.  You throw up (vomit).  You cannot breathe very well. MAKE SURE YOU:   Understand these instructions.  Will watch your condition.  Will get help right away if you are not doing well or get worse.   This information is not intended to replace advice given to you by your health care provider. Make sure you discuss any questions you have with your health care provider.   Document Released: 05/27/2008 Document Revised: 12/14/2013 Document Reviewed: 10/01/2013 Elsevier Interactive Patient Education [employer]2016 [employer] Inc.

## 2016-04-11 ENCOUNTER — Ambulatory Visit: Payer: 59 | Admitting: Podiatry

## 2016-04-18 ENCOUNTER — Ambulatory Visit: Payer: 59 | Admitting: Podiatry

## 2016-04-24 ENCOUNTER — Ambulatory Visit (INDEPENDENT_AMBULATORY_CARE_PROVIDER_SITE_OTHER): Payer: 59 | Admitting: Podiatry

## 2016-04-24 ENCOUNTER — Other Ambulatory Visit: Payer: Self-pay

## 2016-04-24 ENCOUNTER — Encounter: Payer: Self-pay | Admitting: Podiatry

## 2016-04-24 VITALS — BP 153/97 | HR 83

## 2016-04-24 DIAGNOSIS — M216X9 Other acquired deformities of unspecified foot: Secondary | ICD-10-CM

## 2016-04-24 DIAGNOSIS — M21969 Unspecified acquired deformity of unspecified lower leg: Secondary | ICD-10-CM | POA: Diagnosis not present

## 2016-04-24 DIAGNOSIS — M79673 Pain in unspecified foot: Secondary | ICD-10-CM

## 2016-04-24 DIAGNOSIS — M216X1 Other acquired deformities of right foot: Secondary | ICD-10-CM

## 2016-04-24 NOTE — Progress Notes (Signed)
Subjective; 40 year old female presents with pain in right ankle.  Stated that she twisted right ankle last January in ice, still hurts at times. Afternoons it start bothers her, 2-3 times a week. 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 Been to this office many years ago.  Objective: Neurovascular status are within normal. Dermatologic: No abnormal findings. Orthopedic: Forefoot varus with elevated first ray bilateral, tight Achilles tendon right 0 degree dorsiflexion from 90 degree ankle joint, dorsiflexion available about 5 degree on left. Presence of sharp palpable spur on dorsolateral calcaneus just anterior distal to fibular tip right ankle, symptomatic.  Radiographic examination reveal: AP view - short first metatarsal (-5) / long 2nd metatarsal, minimum increase in lateral deviation angle of CCJ bilateral. 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  Lateral view: Severe anterior break in CYMA line, low calcaneal pitch, positive of plantar calcaneal spur, and midtarsal joint sagging bilateral.   Assessment: Ankle equinus right. Elevated first ray bilateral. Cavus type foot. Bone spur right dorsolateral calcaneal bone, symptomatic. Hx. Of ankle sprain right.   Plan: Reviewed clinical findings and available treatment options. Advised to do more frequent stretch exercise for tight Achilles tendon daily. Both feet casted for custom orthotics.

## 2016-04-24 NOTE — Patient Instructions (Signed)
Both feet casted for Orthotics. Need daily stretch exercise for tight Achilles tendon. Wear closed in lace up shoes.

## 2016-07-04 ENCOUNTER — Ambulatory Visit: Payer: 59 | Admitting: Podiatry

## 2016-07-16 ENCOUNTER — Ambulatory Visit: Payer: 59 | Admitting: Podiatry

## 2017-12-17 ENCOUNTER — Emergency Department (HOSPITAL_COMMUNITY): Payer: BLUE CROSS/BLUE SHIELD

## 2017-12-17 ENCOUNTER — Emergency Department (HOSPITAL_COMMUNITY)
Admission: EM | Admit: 2017-12-17 | Discharge: 2017-12-17 | Disposition: A | Discharge status: Home / Self Care | Payer: BLUE CROSS/BLUE SHIELD | Attending: Emergency Medicine | Admitting: Emergency Medicine

## 2017-12-17 ENCOUNTER — Encounter (HOSPITAL_COMMUNITY): Payer: Self-pay | Admitting: Emergency Medicine

## 2017-12-17 ENCOUNTER — Other Ambulatory Visit: Payer: Self-pay

## 2017-12-17 DIAGNOSIS — R109 Unspecified abdominal pain: Secondary | ICD-10-CM

## 2017-12-17 DIAGNOSIS — R112 Nausea with vomiting, unspecified: Secondary | ICD-10-CM | POA: Diagnosis present

## 2017-12-17 DIAGNOSIS — K529 Noninfective gastroenteritis and colitis, unspecified: Secondary | ICD-10-CM

## 2017-12-17 DIAGNOSIS — Z79899 Other long term (current) drug therapy: Secondary | ICD-10-CM | POA: Diagnosis not present

## 2017-12-17 LAB — CBC
HEMATOCRIT: 38.3 % (ref 36.0–46.0)
HEMOGLOBIN: 12.8 g/dL (ref 12.0–15.0)
MCH: 26.6 pg (ref 26.0–34.0)
MCHC: 33.4 g/dL (ref 30.0–36.0)
MCV: 79.5 fL (ref 78.0–100.0)
Platelets: 350 10*3/uL (ref 150–400)
RBC: 4.82 MIL/uL (ref 3.87–5.11)
RDW: 14.3 % (ref 11.5–15.5)
WBC: 7.8 10*3/uL (ref 4.0–10.5)

## 2017-12-17 LAB — URINALYSIS, MICROSCOPIC (REFLEX)

## 2017-12-17 LAB — COMPREHENSIVE METABOLIC PANEL
ALBUMIN: 4 g/dL (ref 3.5–5.0)
ALT: 23 U/L (ref 14–54)
ANION GAP: 10 (ref 5–15)
AST: 27 U/L (ref 15–41)
Alkaline Phosphatase: 80 U/L (ref 38–126)
BUN: 11 mg/dL (ref 6–20)
CHLORIDE: 103 mmol/L (ref 101–111)
CO2: 24 mmol/L (ref 22–32)
Calcium: 9.1 mg/dL (ref 8.9–10.3)
Creatinine, Ser: 1 mg/dL (ref 0.44–1.00)
GFR calc Af Amer: >60 mL/min (ref >60)
GFR calc non Af Amer: >60 mL/min (ref >60)
GLUCOSE: 100 mg/dL — AB (ref 65–99)
POTASSIUM: 3.4 mmol/L — AB (ref 3.5–5.1)
SODIUM: 137 mmol/L (ref 135–145)
TOTAL PROTEIN: 8.1 g/dL (ref 6.5–8.1)
Total Bilirubin: 0.7 mg/dL (ref 0.3–1.2)

## 2017-12-17 LAB — LIPASE, BLOOD: Lipase: 18 U/L (ref 11–51)

## 2017-12-17 LAB — I-STAT BETA HCG BLOOD, ED (MC, WL, AP ONLY): I-stat hCG, quantitative: <5 m[IU]/mL (ref <5)

## 2017-12-17 LAB — URINALYSIS, ROUTINE W REFLEX MICROSCOPIC

## 2017-12-17 MED ORDER — IOPAMIDOL (ISOVUE-300) INJECTION 61%
INTRAVENOUS | Status: AC
  Administered 2017-12-17: 100 mL
  Filled 2017-12-17: qty 100

## 2017-12-17 MED ORDER — CIPROFLOXACIN HCL 500 MG PO TABS: 500.0000 mg | ORAL_TABLET | Freq: Two times a day (BID) | ORAL | 0 refills | Status: DC

## 2017-12-17 MED ORDER — MORPHINE SULFATE (PF) 4 MG/ML IV SOLN
4.0000 mg | Freq: Once | INTRAVENOUS | Status: AC
  Administered 2017-12-17: 4 mg via INTRAVENOUS
  Filled 2017-12-17: qty 1

## 2017-12-17 MED ORDER — ONDANSETRON HCL 4 MG/2ML IJ SOLN
4.0000 mg | Freq: Once | INTRAMUSCULAR | Status: AC
  Administered 2017-12-17: 4 mg via INTRAVENOUS
  Filled 2017-12-17: qty 2

## 2017-12-17 MED ORDER — METRONIDAZOLE 500 MG PO TABS: 500.0000 mg | ORAL_TABLET | Freq: Two times a day (BID) | ORAL | 0 refills | Status: DC

## 2017-12-17 MED ORDER — ONDANSETRON 8 MG PO TBDP
8.0000 mg | ORAL_TABLET | Freq: Three times a day (TID) | ORAL | 0 refills | Status: DC | PRN
Start: 2017-12-17 — End: 2022-02-07

## 2017-12-17 MED ORDER — METRONIDAZOLE 500 MG PO TABS
500.0000 mg | ORAL_TABLET | Freq: Once | ORAL | Status: AC
  Administered 2017-12-17: 500 mg via ORAL
  Filled 2017-12-17: qty 1

## 2017-12-17 MED ORDER — SODIUM CHLORIDE 0.9 % IV BOLUS (SEPSIS)
1000.0000 mL | Freq: Once | INTRAVENOUS | Status: AC
  Administered 2017-12-17: 1000 mL via INTRAVENOUS

## 2017-12-17 MED ORDER — CIPROFLOXACIN HCL 500 MG PO TABS
500.0000 mg | ORAL_TABLET | Freq: Once | ORAL | Status: AC
  Administered 2017-12-17: 500 mg via ORAL
  Filled 2017-12-17: qty 1

## 2017-12-17 NOTE — ED Notes (Signed)
Patient reports she has not vomited since noon, reports has drink ginger ale and now has  been able to keep down

## 2017-12-17 NOTE — ED Provider Notes (Signed)
[facility] Provider Note   CSN: [id number] Arrival date & time: 12/17/17  1453     History   Chief Complaint Chief Complaint  Patient presents with  . Abdominal Pain  . Diarrhea    HPI Penny Savage is a 41 y.o. female.  HPI Patient is a 41 year old female presents the emergency department nausea vomiting and diarrhea over the past 12 hours.  She reports a small amount of blood in her stool.  She has had diverticulitis before.  She feels like all of her symptoms began after eating dinner last night.  No fevers or chills.  Decreased oral intake today.  Feels generally weak.  No other complaints.  Symptoms are moderate in severity.    Past Medical History:  Diagnosis Date  . Diverticulitis     Patient Active Problem List   Diagnosis Date Noted  . 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 DILATION AND CURETTAGE OF UTERUS  april 2012  . FOOT SURGERY  2001    OB History    No data available       Home Medications    Prior to Admission medications   Medication Sig Start Date End Date Taking? Authorizing Provider  ciprofloxacin (CIPRO) 500 MG tablet Take 1 tablet (500 mg total) by mouth 2 (two) times daily. 12/17/17   Azalia Bilisampos, Vern Guerette, MD  hydrochlorothiazide (HYDRODIURIL) 25 MG tablet Take 0.5 tablets (12.5 mg total) by mouth daily. 04/08/16   Doug SouJacubowitz, Sam, MD  metroNIDAZOLE (FLAGYL) 500 MG tablet Take 1 tablet (500 mg total) by mouth 2 (two) times daily. 12/17/17   Azalia Bilisampos, Emillie Chasen, MD  ondansetron (ZOFRAN ODT) 8 MG disintegrating tablet Take 1 tablet (8 mg total) by mouth every 8 (eight) hours as needed for nausea or vomiting. 12/17/17   Azalia Bilisampos, Sivan Cuello, MD    Family History Family History  Problem Relation Age of Onset  . Cancer Maternal Aunt        breast  . Cancer Paternal Grandmother        breast    Social History Social History   Tobacco Use  . Smoking status: Never  Smoker  . Smokeless tobacco: Never Used  Substance Use Topics  . Alcohol use: No    Alcohol/week: 0.0 oz  . Drug use: No     Allergies   Fish allergy and Penicillins   Review of Systems Review of Systems  All other systems reviewed and are negative.    Physical Exam Updated Vital Signs BP (!) 149/97 (BP Location: Right Arm)   Pulse 87   Temp 97.8 F (36.6 C) (Oral)   Resp 18   LMP 12/17/2017   SpO2 100%   Physical Exam  Constitutional: She is oriented to person, place, and time. She appears well-developed and well-nourished. No distress.  HENT:  Head: Normocephalic and atraumatic.  Eyes: EOM are normal.  Neck: Normal range of motion.  Cardiovascular: Normal rate, regular rhythm and normal heart sounds.  Pulmonary/Chest: Effort normal and breath sounds normal.  Abdominal: Soft. She exhibits no distension.  Left-sided abdominal tenderness  Musculoskeletal: Normal range of motion.  Neurological: She is alert and oriented to person, place, and time.  Skin: Skin is warm and dry.  Psychiatric: She has a normal mood and affect. Judgment normal.  Nursing note and vitals reviewed.    ED Treatments / Results  Labs (all labs ordered are listed, but only abnormal results are  displayed) Labs Reviewed  COMPREHENSIVE METABOLIC PANEL - Abnormal; Notable for the following components:      Result Value   Potassium 3.4 (*)    Glucose, Bld 100 (*)    All other components within normal limits  URINALYSIS, ROUTINE W REFLEX MICROSCOPIC - Abnormal; Notable for the following components:   Color, Urine RED (*)    APPearance TURBID (*)    Glucose, UA   (*)    Value: TEST NOT REPORTED DUE TO COLOR INTERFERENCE OF URINE PIGMENT   Hgb urine dipstick   (*)    Value: TEST NOT REPORTED DUE TO COLOR INTERFERENCE OF URINE PIGMENT   Bilirubin Urine   (*)    Value: TEST NOT REPORTED DUE TO COLOR INTERFERENCE OF URINE PIGMENT   Ketones, ur   (*)    Value: TEST NOT REPORTED DUE TO COLOR  INTERFERENCE OF URINE PIGMENT   Protein, ur   (*)    Value: TEST NOT REPORTED DUE TO COLOR INTERFERENCE OF URINE PIGMENT   Nitrite   (*)    Value: TEST NOT REPORTED DUE TO COLOR INTERFERENCE OF URINE PIGMENT   Leukocytes, UA   (*)    Value: TEST NOT REPORTED DUE TO COLOR INTERFERENCE OF URINE PIGMENT   All other components within normal limits  URINALYSIS, MICROSCOPIC (REFLEX) - Abnormal; Notable for the following components:   Bacteria, UA FEW (*)    Squamous Epithelial / LPF 6-30 (*)    All other components within normal limits  LIPASE, BLOOD  CBC  I-STAT BETA HCG BLOOD, ED ([facility], [facility], [facility] ONLY)    EKG  EKG Interpretation None       Radiology Ct Abdomen Pelvis W Contrast  Result Date: 12/17/2017 CLINICAL DATA:  Abdominal pain since December 25th. Vomiting and diarrhea. EXAM: CT ABDOMEN AND PELVIS WITH CONTRAST TECHNIQUE: Multidetector CT imaging of the abdomen and pelvis was performed using the standard protocol following bolus administration of intravenous contrast. CONTRAST:  100 cc ISOVUE-300 IOPAMIDOL (ISOVUE-300) INJECTION 61% COMPARISON:  CT abdomen dated 09/22/2013 FINDINGS: Lower chest: No acute abnormality. Hepatobiliary: No focal liver abnormality is seen. No gallstones, gallbladder wall thickening, or biliary dilatation. Pancreas: Unremarkable. No pancreatic ductal dilatation or surrounding inflammatory changes. Spleen: Normal in size without focal abnormality. Adrenals/Urinary Tract: Adrenal glands are unremarkable. Kidneys are normal, without renal calculi, focal lesion, or hydronephrosis. Bladder is unremarkable. Stomach/Bowel: Diverticulosis throughout the transverse, descending and sigmoid colon. Thickening of the walls of the splenic flexure and proximal descending colon, with associated pericolonic fluid stranding/inflammation, most likely acute diverticulitis. No dilated large or small bowel loops. Vascular/Lymphatic: No significant vascular findings are present. No  enlarged abdominal or pelvic lymph nodes. Reproductive: Uterus and bilateral adnexa are unremarkable. Other: No abscess collections seen.  No free intraperitoneal air. Musculoskeletal: No acute or suspicious osseous finding. IMPRESSION: 1. Thickening of the walls of the large bowel from the splenic flexure to proximal/mid descending colon, with associated pericolonic fluid stranding/inflammation. As there is diverticulosis throughout the colon, I suspect findings are related to acute diverticulitis. Alternatively, given the extent of colonic involvement, this may represent a localized colitis of infectious or inflammatory nature. 2. No associated abscess collection. No evidence of bowel perforation. No associated bowel obstruction. Electronically Signed   By: Bary RichardStan  Maynard M.D.   On: 12/17/2017 20:35    Procedures Procedures (including critical care time)  Medications Ordered in ED Medications  ondansetron (ZOFRAN) injection 4 mg (4 mg Intravenous Given 12/17/17 1951)  morphine 4 MG/ML  injection 4 mg (4 mg Intravenous Given 12/17/17 1951)  sodium chloride 0.9 % bolus 1,000 mL (1,000 mLs Intravenous New Bag/Given 12/17/17 1951)  iopamidol (ISOVUE-300) 61 % injection (100 mLs  Contrast Given 12/17/17 2017)  ciprofloxacin (CIPRO) tablet 500 mg (500 mg Oral Given 12/17/17 2048)  metroNIDAZOLE (FLAGYL) tablet 500 mg (500 mg Oral Given 12/17/17 2048)     Initial Impression / Assessment and Plan / ED Course  I have reviewed the triage vital signs and the nursing notes.  Pertinent labs & imaging results that were available during my care of the patient were reviewed by me and considered in my medical decision making (see chart for details).     Patient with acute onset left-sided colitis.  Symptoms improved in the emergency department.  Cannot rule out simple diverticulitis as well.  Patient be placed on Cipro Flagyl.  She understands return to the ER for new or worsening symptoms.  Final Clinical  Impressions(s) / ED Diagnoses   Final diagnoses:  Acute abdominal pain  Colitis    ED Discharge Orders        Ordered    ciprofloxacin (CIPRO) 500 MG tablet  2 times daily     12/17/17 2110    metroNIDAZOLE (FLAGYL) 500 MG tablet  2 times daily     12/17/17 2110    ondansetron (ZOFRAN ODT) 8 MG disintegrating tablet  Every 8 hours PRN     12/17/17 2110       Azalia Bilis, MD 12/17/17 2119

## 2017-12-17 NOTE — ED Notes (Signed)
Pt ambulated to the restroom with out difficulty.

## 2017-12-17 NOTE — ED Triage Notes (Signed)
Pt to ER for evaluation of LLQ abd pain x1 day with diarrhea. Hx of diverticulosis. States has been unable to tolerate PO fluids/food. NAD.

## 2017-12-17 NOTE — ED Notes (Signed)
Patient transported to CT 

## 2017-12-17 NOTE — ED Notes (Signed)
ED Provider at bedside. 

## 2017-12-17 NOTE — ED Notes (Signed)
Patient verbalized and repeated back information regarding d/c. No signature pad/WOW available for d/c.

## 2022-02-07 ENCOUNTER — Emergency Department: Payer: 59

## 2022-02-07 ENCOUNTER — Observation Stay
Admission: EM | Admit: 2022-02-07 | Discharge: 2022-02-08 | Disposition: A | Discharge status: Home / Self Care | Payer: 59 | Attending: Student in an Organized Health Care Education/Training Program | Admitting: Student in an Organized Health Care Education/Training Program

## 2022-02-07 ENCOUNTER — Other Ambulatory Visit: Payer: Self-pay

## 2022-02-07 ENCOUNTER — Encounter: Payer: Self-pay | Admitting: Emergency Medicine

## 2022-02-07 DIAGNOSIS — K5733 Diverticulitis of large intestine without perforation or abscess with bleeding: Secondary | ICD-10-CM | POA: Diagnosis not present

## 2022-02-07 DIAGNOSIS — D62 Acute posthemorrhagic anemia: Secondary | ICD-10-CM | POA: Diagnosis not present

## 2022-02-07 DIAGNOSIS — K5791 Diverticulosis of intestine, part unspecified, without perforation or abscess with bleeding: Secondary | ICD-10-CM | POA: Diagnosis present

## 2022-02-07 DIAGNOSIS — K625 Hemorrhage of anus and rectum: Secondary | ICD-10-CM | POA: Diagnosis present

## 2022-02-07 DIAGNOSIS — Z20822 Contact with and (suspected) exposure to covid-19: Secondary | ICD-10-CM | POA: Diagnosis not present

## 2022-02-07 DIAGNOSIS — K922 Gastrointestinal hemorrhage, unspecified: Secondary | ICD-10-CM | POA: Diagnosis present

## 2022-02-07 DIAGNOSIS — K5792 Diverticulitis of intestine, part unspecified, without perforation or abscess without bleeding: Secondary | ICD-10-CM

## 2022-02-07 HISTORY — DX: Gastrointestinal hemorrhage, unspecified: K92.2

## 2022-02-07 LAB — CBC WITH DIFFERENTIAL/PLATELET
Abs Immature Granulocytes: 0.02 10*3/uL (ref 0.00–0.07)
Basophils Absolute: 0 10*3/uL (ref 0.0–0.1)
Basophils Relative: 1 %
Eosinophils Absolute: 0.2 10*3/uL (ref 0.0–0.5)
Eosinophils Relative: 4 %
HCT: 30.3 % — ABNORMAL LOW (ref 36.0–46.0)
Hemoglobin: 9.4 g/dL — ABNORMAL LOW (ref 12.0–15.0)
Immature Granulocytes: 0 %
Lymphocytes Relative: 32 %
Lymphs Abs: 1.9 10*3/uL (ref 0.7–4.0)
MCH: 23.5 pg — ABNORMAL LOW (ref 26.0–34.0)
MCHC: 31 g/dL (ref 30.0–36.0)
MCV: 75.8 fL — ABNORMAL LOW (ref 80.0–100.0)
Monocytes Absolute: 0.3 10*3/uL (ref 0.1–1.0)
Monocytes Relative: 5 %
Neutro Abs: 3.4 10*3/uL (ref 1.7–7.7)
Neutrophils Relative %: 58 %
Platelets: 395 10*3/uL (ref 150–400)
RBC: 4 MIL/uL (ref 3.87–5.11)
RDW: 15.2 % (ref 11.5–15.5)
WBC: 5.8 10*3/uL (ref 4.0–10.5)
nRBC: 0 % (ref 0.0–0.2)

## 2022-02-07 LAB — LIPASE, BLOOD: Lipase: 24 U/L (ref 11–51)

## 2022-02-07 LAB — IRON AND TIBC
Iron: 22 ug/dL — ABNORMAL LOW (ref 28–170)
Saturation Ratios: 5 % — ABNORMAL LOW (ref 10.4–31.8)
TIBC: 410 ug/dL (ref 250–450)
UIBC: 388 ug/dL

## 2022-02-07 LAB — RESP PANEL BY RT-PCR (FLU A&B, COVID) ARPGX2
Influenza A by PCR: NEGATIVE
Influenza B by PCR: NEGATIVE
SARS Coronavirus 2 by RT PCR: NEGATIVE

## 2022-02-07 LAB — COMPREHENSIVE METABOLIC PANEL
ALT: 10 U/L (ref 0–44)
AST: 16 U/L (ref 15–41)
Albumin: 3.7 g/dL (ref 3.5–5.0)
Alkaline Phosphatase: 71 U/L (ref 38–126)
Anion gap: 5 (ref 5–15)
BUN: 14 mg/dL (ref 6–20)
CO2: 24 mmol/L (ref 22–32)
Calcium: 8.4 mg/dL — ABNORMAL LOW (ref 8.9–10.3)
Chloride: 108 mmol/L (ref 98–111)
Creatinine, Ser: 1.16 mg/dL — ABNORMAL HIGH (ref 0.44–1.00)
GFR, Estimated: 59 mL/min — ABNORMAL LOW (ref >60)
Glucose, Bld: 118 mg/dL — ABNORMAL HIGH (ref 70–99)
Potassium: 3.6 mmol/L (ref 3.5–5.1)
Sodium: 137 mmol/L (ref 135–145)
Total Bilirubin: 0.3 mg/dL (ref 0.3–1.2)
Total Protein: 7.4 g/dL (ref 6.5–8.1)

## 2022-02-07 LAB — URINALYSIS, ROUTINE W REFLEX MICROSCOPIC
Bilirubin Urine: NEGATIVE
Glucose, UA: NEGATIVE mg/dL
Ketones, ur: NEGATIVE mg/dL
Leukocytes,Ua: NEGATIVE
Nitrite: NEGATIVE
Protein, ur: NEGATIVE mg/dL
Specific Gravity, Urine: 1.013 (ref 1.005–1.030)
pH: 6 (ref 5.0–8.0)

## 2022-02-07 LAB — HIV ANTIBODY (ROUTINE TESTING W REFLEX): HIV Screen 4th Generation wRfx: NONREACTIVE

## 2022-02-07 LAB — POC URINE PREG, ED: Preg Test, Ur: NEGATIVE

## 2022-02-07 LAB — HEMOGLOBIN AND HEMATOCRIT, BLOOD
HCT: 27.3 % — ABNORMAL LOW (ref 36.0–46.0)
Hemoglobin: 8.5 g/dL — ABNORMAL LOW (ref 12.0–15.0)

## 2022-02-07 LAB — TYPE AND SCREEN
ABO/RH(D): O POS
Antibody Screen: NEGATIVE

## 2022-02-07 LAB — CBG MONITORING, ED: Glucose-Capillary: 86 mg/dL (ref 70–99)

## 2022-02-07 MED ORDER — CIPROFLOXACIN IN D5W 400 MG/200ML IV SOLN
400.0000 mg | Freq: Once | INTRAVENOUS | Status: AC
Start: 2022-02-07 — End: 2022-02-07
  Administered 2022-02-07: 400 mg via INTRAVENOUS
  Filled 2022-02-07: qty 200

## 2022-02-07 MED ORDER — ACETAMINOPHEN 650 MG RE SUPP: 650.0000 mg | Freq: Four times a day (QID) | RECTAL | Status: DC | PRN

## 2022-02-07 MED ORDER — ACETAMINOPHEN 325 MG PO TABS
650.0000 mg | ORAL_TABLET | Freq: Four times a day (QID) | ORAL | Status: DC | PRN
  Administered 2022-02-07 – 2022-02-08 (×3): 650 mg via ORAL
  Filled 2022-02-07 (×3): qty 2

## 2022-02-07 MED ORDER — SODIUM CHLORIDE 0.9 % IV SOLN: INTRAVENOUS | Status: DC

## 2022-02-07 MED ORDER — AMLODIPINE BESYLATE 5 MG PO TABS
5.0000 mg | ORAL_TABLET | Freq: Every day | ORAL | Status: DC
  Administered 2022-02-07 – 2022-02-08 (×2): 5 mg via ORAL
  Filled 2022-02-07 (×2): qty 1

## 2022-02-07 MED ORDER — SODIUM CHLORIDE 0.9 % IV BOLUS
1000.0000 mL | Freq: Once | INTRAVENOUS | Status: AC
  Administered 2022-02-07: 1000 mL via INTRAVENOUS

## 2022-02-07 MED ORDER — IOHEXOL 350 MG/ML SOLN
100.0000 mL | Freq: Once | INTRAVENOUS | Status: AC | PRN
  Administered 2022-02-07: 100 mL via INTRAVENOUS

## 2022-02-07 MED ORDER — ONDANSETRON HCL 4 MG PO TABS
4.0000 mg | ORAL_TABLET | Freq: Four times a day (QID) | ORAL | Status: DC | PRN
  Administered 2022-02-08: 4 mg via ORAL
  Filled 2022-02-07: qty 1

## 2022-02-07 MED ORDER — METRONIDAZOLE 500 MG/100ML IV SOLN
500.0000 mg | Freq: Once | INTRAVENOUS | Status: AC
  Administered 2022-02-07: 500 mg via INTRAVENOUS
  Filled 2022-02-07: qty 100

## 2022-02-07 MED ORDER — SODIUM CHLORIDE 0.9 % IV SOLN: INTRAVENOUS | Status: AC

## 2022-02-07 MED ORDER — ONDANSETRON HCL 4 MG/2ML IJ SOLN
4.0000 mg | Freq: Four times a day (QID) | INTRAMUSCULAR | Status: DC | PRN
  Administered 2022-02-07 – 2022-02-08 (×2): 4 mg via INTRAVENOUS
  Filled 2022-02-07 (×2): qty 2

## 2022-02-07 NOTE — ED Notes (Signed)
Patient transported to CT 

## 2022-02-07 NOTE — ED Notes (Signed)
X-ray at bedside

## 2022-02-07 NOTE — Consult Note (Addendum)
GI Inpatient Consult Note  Reason for Consult: GI Bleeding   Attending Requesting Consult: Dr. Francine Graven  History of Present Illness: Penny Savage is a 46 y.o. female seen for evaluation of GI bleeding at the request of Dr. Francine Graven. No known significant PMHx.   Patient reports around 7pm yesterday evening developing issues with what was initially bright red blood with wiping, then over the next 30 minutes she started developing episodes of passing bright red blood, dark blood and clots rectally.  She reports these were not associated with bowel movements and happening at least every 30 minutes to 1 hour.  She does not have any abdominal pain or rectal pain with the episodes. After several episodes she presented to the ER for evaluation. She reports she has had approximately 5-6 additional episodes of passing blood since being in the ER but does note the last episode about 10 minutes ago did not appear as large volume.  Chronically she denies any GI symptoms.  She generally has 2 bowel movements a day.  Infrequently has to take mag citrate for very occasional constipation.  Reports she was diagnosed with diverticulitis in 2014.  Over the past 8 years she has had a couple episodes of "twinges of lower abdominal pain" that have resolved with a clear liquid diet. Has only had antibiotics 1-2 times over the past few years, per chart review last around 2018.  She reports traveled for work last week and was eating differently than normal. Had GERD sx at that time but but otherwise denies any chronic GERD.  She denies any nausea, vomiting, dysphagia.  She denies any family hx colon polyps or colon cancer, denies any alcohol or tobacco. Very occasional excedrin use but denies frequent NSAIDs.  Last Colonoscopy: per patient 5-7 years ago, not able to find report on file. Believes it was normal.   Last Endoscopy: none prior    Past Medical History:  Past Medical History:  Diagnosis Date    Diverticulitis     Problem List: Patient Active Problem List   Diagnosis Date Noted   GI bleed 02/07/2022   Diverticulosis of intestine with bleeding 02/07/2022   ABLA (acute blood loss anemia) 02/07/2022   Metatarsal deformity 04/02/2016    Past Surgical History: Past Surgical History:  Procedure Laterality Date   DILATION AND CURETTAGE OF UTERUS  april 2012   FOOT SURGERY  2001    Allergies: Allergies  Allergen Reactions   Fish Allergy Itching    Scratchy throat   Penicillins Hives    Has patient had a PCN reaction causing immediate rash, facial/tongue/throat swelling, SOB or lightheadedness with hypotension: No Has patient had a PCN reaction causing severe rash involving mucus membranes or skin necrosis: No Has patient had a PCN reaction that required hospitalization No Has patient had a PCN reaction occurring within the last 10 years: No If all of the above answers are "NO", then may proceed with Cephalosporin use.    Home Medications: (Not in a hospital admission)  Home medication reconciliation was completed with the patient.   Scheduled Inpatient Medications:    Continuous Inpatient Infusions:    sodium chloride 100 mL/hr at 02/07/22 [id number]   sodium chloride 100 mL/hr at 02/07/22 0937    PRN Inpatient Medications:  acetaminophen **OR** acetaminophen, ondansetron **OR** ondansetron (ZOFRAN) IV  Family History: family history includes Cancer in her maternal aunt and paternal grandmother.    Social History:   reports that she has never smoked. She has  never used smokeless tobacco. She reports that she does not drink alcohol and does not use drugs.   Review of Systems: Constitutional: Weight is stable.  Eyes: No changes in vision. ENT: No oral lesions, sore throat.  GI: see HPI.  Heme/Lymph: No easy bruising.  CV: No chest pain.  GU: No hematuria.  Integumentary: No rashes.  Neuro: No headaches.  Psych: No depression/anxiety.  Endocrine: No heat/cold  intolerance.  Allergic/Immunologic: No urticaria.  Resp: No cough, SOB.  Musculoskeletal: No joint swelling.    Physical Examination: BP (!) 185/94    Pulse 77    Temp 98 F (36.7 C) (Oral)    Resp 16    Ht 5\' 5"  (1.651 m)    Wt 131.5 kg    LMP 01/14/2022 (Exact Date)    SpO2 100%    BMI 48.26 kg/m  Gen: NAD, alert and oriented x 4, resting in ER, appears comfortable.  HEENT: PEERLA, EOMI, Neck: supple, no JVD or thyromegaly Chest: CTA bilaterally, no wheezes, crackles, or other adventitious sounds CV: RRR, no m/g/c/r Abd: soft, non tender, ND, +BS in all four quadrants; no HSM, guarding, ridigity, or rebound tenderness Ext: no edema, well perfused with 2+ pulses, Skin: no rash or lesions noted Lymph: no LAD  Data: Lab Results  Component Value Date   WBC 5.8 02/07/2022   HGB 9.4 (L) 02/07/2022   HCT 30.3 (L) 02/07/2022   MCV 75.8 (L) 02/07/2022   PLT 395 02/07/2022   Comparison CBC 4 years ago was 12.8.   Recent Labs  Lab 02/07/22 0212  HGB 9.4*   Lab Results  Component Value Date   NA 137 02/07/2022   K 3.6 02/07/2022   CL 108 02/07/2022   CO2 24 02/07/2022   BUN 14 02/07/2022   CREATININE 1.16 (H) 02/07/2022   Lab Results  Component Value Date   ALT 10 02/07/2022   AST 16 02/07/2022   ALKPHOS 71 02/07/2022   BILITOT 0.3 02/07/2022   No results for input(s): APTT, INR, PTT in the last 168 hours.  CT angio GI Bleed-today-IMPRESSION: 1. No evidence for active GI bleeding. 2. Extensive colonic diverticulosis with mild wall thickening and trace pericolonic soft tissue stranding involving the proximal sigmoid colon. Findings may reflect early or mild diverticulitis without complications. In a patient presenting with lower GI bleed correlation with colon cancer screening is recommended following resolution of this acute episode.   Assessment/Plan: [name] is a 46 y.o. female admitted for rectal bleeding.   Rectal bleeding - most likely diverticular in etiology  based on CT findings with associated rectal bleeding, will plan to continue Cipro/flagyl IV while inpatient w/ transition to oral ABX at d/c. Will continue on clear liquid diet for now, monitor clinically for increase in bleeding, monitor H/H.  We will hold off on colonoscopy while inpatient in the setting of active inflammation unless bleeding increases significantly. She feels it is currently slowing down and may be passing old blood at this point. We will plan to see her back in the office after discharge and arrange for outpatient colonoscopy in 6 to 8 weeks.  Recommendations:  Continue ABX w/ Cipro/Flagyl, monitor overnight, oral ABX at d/c Continue clear liquid diet today. Plan for outpatient colonoscopy in 6-8 weeks to follow up CT findings as above, hold off on inpatient colonoscopy at this time unless symptomatically worsens Monitor H/h while in patient.   Case was discussed with Dr. Virgina Jock. Thank you for the consult. Please call  with questions or concerns.  Ezzard Standing, PA-C [facility]

## 2022-02-07 NOTE — Care Management (Signed)
RNCM attempted to see patient, patient was to ill, asked TOC to assess at another time.

## 2022-02-07 NOTE — H&P (Signed)
History and Physical    Patient: Penny Savage YWV:[id number] DOB: 02/12/1976 DOA: 02/07/2022 DOS: the patient was seen and examined on 02/07/2022 PCP: Nila Nephew, MD  Patient coming from: Home  Chief Complaint:  Chief Complaint  Patient presents with   Rectal Bleeding    HPI: Penny Savage is a 46 y.o. female with medical history significant for morbid obesity and diverticular disease who presents to the emergency room for evaluation of multiple episodes of rectal bleeding associated with blood clots. Patient states that symptoms started 1 day prior to hospitalization and that she had gone to the bathroom to void when she had the first episode of rectal bleeding with large blood clots.  She denied having any abdominal pain or discomfort.  She has had several episodes since then.  She denies having any hematemesis or melena and has had no fever or chills She presented to the ER due to persistent symptoms. She denies NSAID use. She denies having any chest pain, no shortness of breath, no dizziness, no lightheadedness, no nausea, no vomiting, no urinary frequency, no nocturia, no dysuria, no headache, no fever, no chills, no blurred vision no focal deficit.  Review of Systems: As mentioned in the history of present illness. All other systems reviewed and are negative. Past Medical History:  Diagnosis Date   Diverticulitis    Past Surgical History:  Procedure Laterality Date   DILATION AND CURETTAGE OF UTERUS  april 2012   FOOT SURGERY  2001   Social History:  reports that she has never smoked. She has never used smokeless tobacco. She reports that she does not drink alcohol and does not use drugs.  Allergies  Allergen Reactions   Fish Allergy Itching    Scratchy throat   Penicillins Hives    Has patient had a PCN reaction causing immediate rash, facial/tongue/throat swelling, SOB or lightheadedness with hypotension: No Has patient had a PCN reaction causing severe rash involving  mucus membranes or skin necrosis: No Has patient had a PCN reaction that required hospitalization No Has patient had a PCN reaction occurring within the last 10 years: No If all of the above answers are "NO", then may proceed with Cephalosporin use.    Family History  Problem Relation Age of Onset   Cancer Maternal Aunt        breast   Cancer Paternal Grandmother        breast    Prior to Admission medications   Medication Sig Start Date End Date Taking? Authorizing Provider  aspirin-acetaminophen-caffeine (EXCEDRIN MIGRAINE) [phone] MG tablet Take 1 tablet by mouth every 6 (six) hours as needed for headache.   Yes [provider]  OVER THE COUNTER MEDICATION Take 1 tablet by mouth daily. Garden of Life Probiotic 1 billion   Yes [provider]    Physical Exam: Vitals:   02/07/22 0207 02/07/22 0214 02/07/22 0449 02/07/22 0719  BP:  (!) 195/110 (!) 185/98 (!) 157/97  Pulse:  92 95 82  Resp:  16 17 16   Temp:  98 F (36.7 C)    TempSrc:  Oral    SpO2:  98% 100% 100%  Weight: 131.5 kg     Height: 5\' 5"  (1.651 m)      Physical Exam Vitals and nursing note reviewed.  Constitutional:      Appearance: Normal appearance. She is obese.  HENT:     Head: Normocephalic and atraumatic.  Eyes:     Pupils: Pupils are equal, round,  and reactive to light.     Comments: Pale conjunctiva  Cardiovascular:     Rate and Rhythm: Normal rate and regular rhythm.  Pulmonary:     Effort: Pulmonary effort is normal.  Abdominal:     General: Abdomen is flat. Bowel sounds are normal.     Palpations: Abdomen is soft.  Musculoskeletal:        General: Normal range of motion.     Cervical back: Normal range of motion and neck supple.  Skin:    General: Skin is warm and dry.  Neurological:     General: No focal deficit present.     Mental Status: She is alert and oriented to person, place, and time.  Psychiatric:        Mood and Affect: Mood normal.        Behavior:  Behavior normal.     Data Reviewed: Notes from primary care and specialist visits, past discharge summaries. Prior diagnostic testing as applicable to current admission diagnoses Updated medications and problem lists for reconciliation ED course, including vitals, labs, imaging, treatment and response to treatment Triage notes and ED providers notes Hemoglobin 9.4 down from 12.  MCV 75.8 Chest x-ray reviewed by me shows no evidence of acute cardiopulmonary disease CT angiogram of the abdomen and pelvis shows extensive colonic diverticulosis with mild wall thickening and trace pericolonic soft tissue stranding involving the proximal sigmoid colon.  Findings may reflect early or mild diverticulitis without complication.  There are no new results to review at this time.  Assessment and Plan:  Principal Problem:   ABLA (acute blood loss anemia) Active Problems:   GI bleed   Diverticulosis of intestine with bleeding    Acute blood loss anemia From painless rectal bleeding in a patient with known diverticular disease. She has no abdominal pain, no leukocytosis and is afebrile to suggest acute diverticulitis Hemoglobin on admission is 9.4 g/dl compared to 29.9 g/dl from 4 years ago Patient is currently asymptomatic We will monitor serial H&H and transfuse as needed Consult GI    Morbid obesity (BMI 48.26) Complicates overall prognosis and care  Advance Care Planning:   Code Status: Full Code   Consults: Gastroenterology  Family Communication: Greater than 50% of time was spent discussing patient's condition and plan of care with her and her mother at the bedside.  All questions and concerns have been addressed.  They verbalized understanding and agree with the plan.  Severity of Illness: The appropriate patient status for this patient is INPATIENT. Inpatient status is judged to be reasonable and necessary in order to provide the required intensity of service to ensure the  patient's safety. The patient's presenting symptoms, physical exam findings, and initial radiographic and laboratory data in the context of their chronic comorbidities is felt to place them at high risk for further clinical deterioration. Furthermore, it is not anticipated that the patient will be medically stable for discharge from the hospital within 2 midnights of admission.   * I certify that at the point of admission it is my clinical judgment that the patient will require inpatient hospital care spanning beyond 2 midnights from the point of admission due to high intensity of service, high risk for further deterioration and high frequency of surveillance required.*  Author: Lucile Shutters, MD 02/07/2022 9:13 AM  For on call review [email or web address].

## 2022-02-07 NOTE — ED Triage Notes (Signed)
Patient ambulatory to triage with steady gait, without difficulty or distress noted; pt reports rectal bleeding noted since yesterday; denies pain, denies any hx of same

## 2022-02-07 NOTE — ED Provider Notes (Signed)
[facility] Provider Note    Event Date/Time   First MD Initiated Contact with Patient 02/07/22 0309     (approximate)   History   Rectal Bleeding   HPI  Penny Savage is a 46 y.o. female who presents to the ED from home with a chief complaint of rectal bleeding since yesterday.  History of diverticulitis without GI bleed.  Remote colonoscopy greater than 5 years ago.  Denies fever, chills, chest pain, shortness of breath, abdominal pain, nausea, vomiting or diarrhea.  States she has been having blood clots when she uses the restroom to have a bowel movement.     Past Medical History   Past Medical History:  Diagnosis Date   Diverticulitis      Active Problem List   Patient Active Problem List   Diagnosis Date Noted   GI bleed 02/07/2022   Metatarsal deformity 04/02/2016     Past Surgical History   Past Surgical History:  Procedure Laterality Date   DILATION AND CURETTAGE OF UTERUS  april 2012   FOOT SURGERY  2001     Home Medications   Prior to Admission medications   Medication Sig Start Date End Date Taking? Authorizing Provider  aspirin-acetaminophen-caffeine (EXCEDRIN MIGRAINE) [phone] MG tablet Take 1 tablet by mouth every 6 (six) hours as needed for headache.   Yes [provider]  OVER THE COUNTER MEDICATION Take 1 tablet by mouth daily. Garden of Life Probiotic 1 billion   Yes [provider]     Allergies  Fish allergy and Penicillins   Family History   Family History  Problem Relation Age of Onset   Cancer Maternal Aunt        breast   Cancer Paternal Grandmother        breast     Physical Exam  Triage Vital Signs: ED Triage Vitals  Enc Vitals Group     BP 02/07/22 0214 (!) 195/110     Pulse Rate 02/07/22 0214 92     Resp 02/07/22 0214 16     Temp 02/07/22 0214 98 F (36.7 C)     Temp Source 02/07/22 0214 Oral     SpO2 02/07/22 0214 98 %     Weight 02/07/22 0207 290 lb (131.5 kg)      Height 02/07/22 0207 5\' 5"  (1.651 m)     Head Circumference --      Peak Flow --      Pain Score 02/07/22 0207 0     Pain Loc --      Pain Edu? --      Excl. in [place]? --     Updated Vital Signs: BP (!) 185/98    Pulse 95    Temp 98 F (36.7 C) (Oral)    Resp 17    Ht 5\' 5"  (1.651 m)    Wt 131.5 kg    LMP 01/14/2022 (Exact Date)    SpO2 100%    BMI 48.26 kg/m    General: Awake, no distress.  CV:  RRR.  Good peripheral perfusion.  Resp:  Normal effort.  CTAB. Abd:  Nontender to light or deep palpation.  No distention.  Other:  Patient had a bowel movement with large blood clot noted:      ED Results / Procedures / Treatments  Labs (all labs ordered are listed, but only abnormal results are displayed) Labs Reviewed  CBC WITH DIFFERENTIAL/PLATELET - Abnormal; Notable for the following components:  Result Value   Hemoglobin 9.4 (*)    HCT 30.3 (*)    MCV 75.8 (*)    MCH 23.5 (*)    All other components within normal limits  COMPREHENSIVE METABOLIC PANEL - Abnormal; Notable for the following components:   Glucose, Bld 118 (*)    Creatinine, Ser 1.16 (*)    Calcium 8.4 (*)    GFR, Estimated 59 (*)    All other components within normal limits  URINALYSIS, ROUTINE W REFLEX MICROSCOPIC - Abnormal; Notable for the following components:   Color, Urine STRAW (*)    APPearance CLEAR (*)    Hgb urine dipstick MODERATE (*)    Bacteria, UA RARE (*)    All other components within normal limits  RESP PANEL BY RT-PCR (FLU A&B, COVID) ARPGX2  LIPASE, BLOOD  POC URINE PREG, ED  TYPE AND SCREEN     EKG  ED ECG REPORT I, Breyonna Nault J, the attending physician, personally viewed and interpreted this ECG.   Date: 02/07/2022  EKG Time: 0409  Rate: 77  Rhythm: normal sinus rhythm  Axis: Normal  Intervals:none  ST&T Change: Nonspecific No prior EKG for comparison   RADIOLOGY I independently visualized and interpreted patient's chest x-ray, CT angio abdomen/pelvis as  well as noted the radiology interpretation:  Chest x-ray: No acute cardiopulmonary process  CT angio abdomen/pelvis: Sense of diverticulosis, probable early diverticulitis  Official radiology report(s): DG Chest Port 1 View  Result Date: 02/07/2022 CLINICAL DATA:  Rectal bleeding EXAM: PORTABLE CHEST 1 VIEW COMPARISON:  None. FINDINGS: Lungs are clear.  No pleural effusion or pneumothorax. The heart is top-normal in size. IMPRESSION: No evidence of acute cardiopulmonary disease. Electronically Signed   By: Julian Hy M.D.   On: 02/07/2022 03:49   CT ANGIO GI BLEED  Result Date: 02/07/2022 CLINICAL DATA:  Rectal bleeding. EXAM: CTA ABDOMEN AND PELVIS WITHOUT AND WITH CONTRAST TECHNIQUE: Multidetector CT imaging of the abdomen and pelvis was performed using the standard protocol during bolus administration of intravenous contrast. Multiplanar reconstructed images and MIPs were obtained and reviewed to evaluate the vascular anatomy. RADIATION DOSE REDUCTION: This exam was performed according to the departmental dose-optimization program which includes automated exposure control, adjustment of the mA and/or kV according to patient size and/or use of iterative reconstruction technique. CONTRAST:  136mL OMNIPAQUE IOHEXOL 350 MG/ML SOLN COMPARISON:  12/17/2017 FINDINGS: VASCULAR Aorta: Normal caliber aorta without aneurysm, dissection, vasculitis or significant stenosis. Celiac: Patent without evidence of aneurysm, dissection, vasculitis or significant stenosis. SMA: Patent without evidence of aneurysm, dissection, vasculitis or significant stenosis. Renals: Both renal arteries are patent without evidence of aneurysm, dissection, vasculitis, fibromuscular dysplasia or significant stenosis. IMA: Patent without evidence of aneurysm, dissection, vasculitis or significant stenosis. Inflow: Patent without evidence of aneurysm, dissection, vasculitis or significant stenosis. Proximal Outflow: Bilateral common  femoral and visualized portions of the superficial and profunda femoral arteries are patent without evidence of aneurysm, dissection, vasculitis or significant stenosis. Veins: No obvious venous abnormality within the limitations of this arterial phase study. Review of the MIP images confirms the above findings. NON-VASCULAR Lower chest: No acute abnormality. Hepatobiliary: No focal liver abnormality is seen. No gallstones, gallbladder wall thickening, or biliary dilatation. Pancreas: Unremarkable. No pancreatic ductal dilatation or surrounding inflammatory changes. Spleen: Normal in size without focal abnormality. Adrenals/Urinary Tract: Normal adrenal glands. No kidney mass or hydronephrosis. Urinary bladder is unremarkable. Stomach/Bowel: Stomach appears normal. No abnormal areas of intraluminal contrast extravasation within the small bowel loops. There is  extensive colonic diverticulosis which is most severe at the level of the sigmoid colon. Mild wall thickening with trace pericolonic soft tissue stranding is noted involving the proximal sigmoid colon, image 57/6 and image 142/5. No corresponding intraluminal contrast extravasation identified within the colon. Lymphatic: No significant vascular findings are present. No enlarged abdominal or pelvic lymph nodes. Reproductive: Uterus and bilateral adnexa are unremarkable. Other: No free fluid or fluid collections Musculoskeletal: No acute or suspicious osseous findings. IMPRESSION: 1. No evidence for active GI bleeding. 2. Extensive colonic diverticulosis with mild wall thickening and trace pericolonic soft tissue stranding involving the proximal sigmoid colon. Findings may reflect early or mild diverticulitis without complications. In a patient presenting with lower GI bleed correlation with colon cancer screening is recommended following resolution of this acute episode. Electronically Signed   By: Kerby Moors M.D.   On: 02/07/2022 05:04      PROCEDURES:  Critical Care performed: No  .1-3 Lead EKG Interpretation Performed by: Paulette Blanch, MD Authorized by: Paulette Blanch, MD     Interpretation: normal     ECG rate:  92   ECG rate assessment: normal     Rhythm: sinus rhythm     Ectopy: none     Conduction: normal   Comments:     Patient placed on cardiac monitor to evaluate for arrhythmias   MEDICATIONS ORDERED IN ED: Medications  ciprofloxacin (CIPRO) IVPB 400 mg (400 mg Intravenous New Bag/Given 02/07/22 0550)  metroNIDAZOLE (FLAGYL) IVPB 500 mg (has no administration in time range)  0.9 %  sodium chloride infusion (has no administration in time range)  sodium chloride 0.9 % bolus 1,000 mL (1,000 mLs Intravenous New Bag/Given 02/07/22 0414)  iohexol (OMNIPAQUE) 350 MG/ML injection 100 mL (100 mLs Intravenous Contrast Given 02/07/22 0434)     IMPRESSION / MDM / ASSESSMENT AND PLAN / ED COURSE  I reviewed the triage vital signs and the nursing notes.                             46 year old female presenting with rectal bleeding. Differential diagnosis includes, but is not limited to, ovarian cyst, ovarian torsion, acute appendicitis, diverticulitis, urinary tract infection/pyelonephritis, endometriosis, bowel obstruction, colitis, renal colic, gastroenteritis, hernia, etc. I have personally reviewed patient's records and see the most recent PCP office visit from 12/11/2021 for seasonal allergies.  The patient is on the cardiac monitor to evaluate for evidence of arrhythmia and/or significant heart rate changes.  Laboratory results notable for H/H 9.4/30.3 which is mildly lower compared with results 4 years ago, increased creatinine 1.16 compared to 4 years ago, urine is unremarkable.  Will type and screen, obtain CT angio GI bleed protocol.  Initiate IV fluid resuscitation, keep NPO.  Anticipate hospitalization.   0515 Updated patient and her mother on CTA results indicative of probable early diverticulitis, no  active extravasation. Will start IV Cipro/Flagyl. Will consult hospitalist services for admission.  FINAL CLINICAL IMPRESSION(S) / ED DIAGNOSES   Final diagnoses:  Rectal bleeding  Diverticulitis  Lower GI bleed     Rx / DC Orders   ED Discharge Orders     None        Note:  This document was prepared using Dragon voice recognition software and may include unintentional dictation errors.   Paulette Blanch, MD 02/07/22 [phone]

## 2022-02-08 LAB — CBC
HCT: 25.2 % — ABNORMAL LOW (ref 36.0–46.0)
Hemoglobin: 7.7 g/dL — ABNORMAL LOW (ref 12.0–15.0)
MCH: 22.8 pg — ABNORMAL LOW (ref 26.0–34.0)
MCHC: 30.6 g/dL (ref 30.0–36.0)
MCV: 74.6 fL — ABNORMAL LOW (ref 80.0–100.0)
Platelets: 365 10*3/uL (ref 150–400)
RBC: 3.38 MIL/uL — ABNORMAL LOW (ref 3.87–5.11)
RDW: 15.3 % (ref 11.5–15.5)
WBC: 6.4 10*3/uL (ref 4.0–10.5)
nRBC: 0 % (ref 0.0–0.2)

## 2022-02-08 LAB — HEMOGLOBIN AND HEMATOCRIT, BLOOD
HCT: 24.3 % — ABNORMAL LOW (ref 36.0–46.0)
Hemoglobin: 7.5 g/dL — ABNORMAL LOW (ref 12.0–15.0)

## 2022-02-08 LAB — BASIC METABOLIC PANEL
Anion gap: 4 — ABNORMAL LOW (ref 5–15)
BUN: 8 mg/dL (ref 6–20)
CO2: 24 mmol/L (ref 22–32)
Calcium: 8.2 mg/dL — ABNORMAL LOW (ref 8.9–10.3)
Chloride: 109 mmol/L (ref 98–111)
Creatinine, Ser: 0.99 mg/dL (ref 0.44–1.00)
GFR, Estimated: >60 mL/min (ref >60)
Glucose, Bld: 103 mg/dL — ABNORMAL HIGH (ref 70–99)
Potassium: 3.5 mmol/L (ref 3.5–5.1)
Sodium: 137 mmol/L (ref 135–145)

## 2022-02-08 MED ORDER — CIPROFLOXACIN IN D5W 400 MG/200ML IV SOLN
400.0000 mg | Freq: Two times a day (BID) | INTRAVENOUS | Status: DC
  Administered 2022-02-08: 400 mg via INTRAVENOUS
  Filled 2022-02-08 (×3): qty 200

## 2022-02-08 MED ORDER — METRONIDAZOLE 500 MG/100ML IV SOLN
500.0000 mg | Freq: Three times a day (TID) | INTRAVENOUS | Status: DC
  Administered 2022-02-08 (×2): 500 mg via INTRAVENOUS
  Filled 2022-02-08 (×5): qty 100

## 2022-02-08 MED ORDER — METRONIDAZOLE 500 MG PO TABS: 500.0000 mg | ORAL_TABLET | Freq: Two times a day (BID) | ORAL | 0 refills | Status: AC

## 2022-02-08 MED ORDER — IRON 325 (65 FE) MG PO TABS
325.0000 mg | ORAL_TABLET | ORAL | 0 refills | Status: AC
Start: 2022-02-08 — End: ?

## 2022-02-08 MED ORDER — CIPROFLOXACIN HCL 500 MG PO TABS: 500.0000 mg | ORAL_TABLET | Freq: Two times a day (BID) | ORAL | 0 refills | Status: AC

## 2022-02-08 NOTE — Progress Notes (Signed)
Inpatient Follow-up/Progress Note   Patient ID: Penny Savage is a 46 y.o. female.  Overnight Events / Subjective Findings Pt with diminished energy. Hgb down to 7.5. Pt notes the bloody bowel movements have slowed down. Feeling like she can eat more at this time. Denies abdominal pain/fever/chills. No n/v. Her mother is at bedside No other acute GI complaints.  Review of Systems  Constitutional:  Positive for fatigue. Negative for activity change, appetite change, chills, diaphoresis, fever and unexpected weight change.  HENT:  Negative for trouble swallowing and voice change.   Respiratory:  Negative for shortness of breath and wheezing.   Cardiovascular:  Negative for chest pain, palpitations and leg swelling.  Gastrointestinal:  Positive for blood in stool. Negative for abdominal distention, abdominal pain, anal bleeding, constipation, diarrhea, nausea, rectal pain and vomiting.  Musculoskeletal:  Negative for arthralgias and myalgias.  Skin:  Negative for color change and pallor.  Neurological:  Positive for weakness. Negative for dizziness and syncope.  Psychiatric/Behavioral:  Negative for confusion.   All other systems reviewed and are negative.   Medications  Current Facility-Administered Medications:    0.9 %  sodium chloride infusion, , Intravenous, Continuous, Agbata, Tochukwu, MD, Last Rate: 100 mL/hr at 02/08/22 0603, Infusion Verify at 02/08/22 0603   acetaminophen (TYLENOL) tablet 650 mg, 650 mg, Oral, Q6H PRN, 650 mg at 02/08/22 0908 **OR** acetaminophen (TYLENOL) suppository 650 mg, 650 mg, Rectal, Q6H PRN, Agbata, Tochukwu, MD   amLODipine (NORVASC) tablet 5 mg, 5 mg, Oral, Daily, Agbata, Tochukwu, MD, 5 mg at 02/08/22 [id number]   ciprofloxacin (CIPRO) IVPB 400 mg, 400 mg, Intravenous, Q12H, Annamaria Helling, DO, Stopped at 02/08/22 0600   metroNIDAZOLE (FLAGYL) IVPB 500 mg, 500 mg, Intravenous, Q8H, Annamaria Helling, DO, Last Rate: 100 mL/hr at 02/08/22 1422,  500 mg at 02/08/22 1422   ondansetron (ZOFRAN) tablet 4 mg, 4 mg, Oral, Q6H PRN, 4 mg at 02/08/22 1421 **OR** ondansetron (ZOFRAN) injection 4 mg, 4 mg, Intravenous, Q6H PRN, Agbata, Tochukwu, MD, 4 mg at 02/08/22 0456  sodium chloride 100 mL/hr at 02/08/22 0603   ciprofloxacin Stopped (02/08/22 0600)   metronidazole 500 mg (02/08/22 1422)    acetaminophen **OR** acetaminophen, ondansetron **OR** ondansetron (ZOFRAN) IV   Objective    Vitals:   02/08/22 0357 02/08/22 0809 02/08/22 1138 02/08/22 1604  BP: 138/81 (!) 152/86 (!) 172/93 (!) 152/85  Pulse: 86 86 83 91  Resp: 20 17 18 18   Temp: 98.6 F (37 C) 97.8 F (36.6 C) 98 F (36.7 C) 97.7 F (36.5 C)  TempSrc: Oral Oral Oral Oral  SpO2: 100% 100% 100% 100%  Weight:      Height:         Physical Exam Vitals and nursing note reviewed.  Constitutional:      General: She is not in acute distress.    Appearance: Normal appearance. She is obese. She is not ill-appearing, toxic-appearing or diaphoretic.  HENT:     Head: Normocephalic and atraumatic.     Nose: Nose normal.     Mouth/Throat:     Mouth: Mucous membranes are moist.     Pharynx: Oropharynx is clear.  Eyes:     General: No scleral icterus.    Extraocular Movements: Extraocular movements intact.  Cardiovascular:     Rate and Rhythm: Normal rate and regular rhythm.     Heart sounds: Normal heart sounds. No murmur heard.   No friction rub. No gallop.  Pulmonary:  Effort: Pulmonary effort is normal. No respiratory distress.     Breath sounds: Normal breath sounds. No wheezing, rhonchi or rales.  Abdominal:     General: Bowel sounds are normal. There is no distension.     Palpations: Abdomen is soft.     Tenderness: There is no abdominal tenderness. There is no guarding or rebound.  Musculoskeletal:     Cervical back: Neck supple.     Right lower leg: No edema.     Left lower leg: No edema.  Skin:    General: Skin is warm and dry.     Coloration: Skin is  not jaundiced or pale.  Neurological:     General: No focal deficit present.     Mental Status: She is alert and oriented to person, place, and time. Mental status is at baseline.  Psychiatric:        Mood and Affect: Mood normal.        Behavior: Behavior normal.        Thought Content: Thought content normal.        Judgment: Judgment normal.     Laboratory Data Recent Labs  Lab 02/07/22 0212 02/07/22 1641 02/08/22 0345 02/08/22 1553  WBC 5.8  --   --  6.4  HGB 9.4* 8.5* 7.5* 7.7*  HCT 30.3* 27.3* 24.3* 25.2*  PLT 395  --   --  365  NEUTOPHILPCT 58  --   --   --   LYMPHOPCT 32  --   --   --   MONOPCT 5  --   --   --   EOSPCT 4  --   --   --    Recent Labs  Lab 02/07/22 0212 02/08/22 0345  NA 137 137  K 3.6 3.5  CL 108 109  CO2 24 24  BUN 14 8  CREATININE 1.16* 0.99  CALCIUM 8.4* 8.2*  PROT 7.4  --   BILITOT 0.3  --   ALKPHOS 71  --   ALT 10  --   AST 16  --   GLUCOSE 118* 103*   No results for input(s): INR in the last 168 hours.    Imaging Studies: DG Chest Port 1 View  Result Date: 02/07/2022 CLINICAL DATA:  Rectal bleeding EXAM: PORTABLE CHEST 1 VIEW COMPARISON:  None. FINDINGS: Lungs are clear.  No pleural effusion or pneumothorax. The heart is top-normal in size. IMPRESSION: No evidence of acute cardiopulmonary disease. Electronically Signed   By: Julian Hy M.D.   On: 02/07/2022 03:49   CT ANGIO GI BLEED  Result Date: 02/07/2022 CLINICAL DATA:  Rectal bleeding. EXAM: CTA ABDOMEN AND PELVIS WITHOUT AND WITH CONTRAST TECHNIQUE: Multidetector CT imaging of the abdomen and pelvis was performed using the standard protocol during bolus administration of intravenous contrast. Multiplanar reconstructed images and MIPs were obtained and reviewed to evaluate the vascular anatomy. RADIATION DOSE REDUCTION: This exam was performed according to the departmental dose-optimization program which includes automated exposure control, adjustment of the mA and/or kV  according to patient size and/or use of iterative reconstruction technique. CONTRAST:  14mL OMNIPAQUE IOHEXOL 350 MG/ML SOLN COMPARISON:  12/17/2017 FINDINGS: VASCULAR Aorta: Normal caliber aorta without aneurysm, dissection, vasculitis or significant stenosis. Celiac: Patent without evidence of aneurysm, dissection, vasculitis or significant stenosis. SMA: Patent without evidence of aneurysm, dissection, vasculitis or significant stenosis. Renals: Both renal arteries are patent without evidence of aneurysm, dissection, vasculitis, fibromuscular dysplasia or significant stenosis. IMA: Patent without evidence of aneurysm, dissection,  vasculitis or significant stenosis. Inflow: Patent without evidence of aneurysm, dissection, vasculitis or significant stenosis. Proximal Outflow: Bilateral common femoral and visualized portions of the superficial and profunda femoral arteries are patent without evidence of aneurysm, dissection, vasculitis or significant stenosis. Veins: No obvious venous abnormality within the limitations of this arterial phase study. Review of the MIP images confirms the above findings. NON-VASCULAR Lower chest: No acute abnormality. Hepatobiliary: No focal liver abnormality is seen. No gallstones, gallbladder wall thickening, or biliary dilatation. Pancreas: Unremarkable. No pancreatic ductal dilatation or surrounding inflammatory changes. Spleen: Normal in size without focal abnormality. Adrenals/Urinary Tract: Normal adrenal glands. No kidney mass or hydronephrosis. Urinary bladder is unremarkable. Stomach/Bowel: Stomach appears normal. No abnormal areas of intraluminal contrast extravasation within the small bowel loops. There is extensive colonic diverticulosis which is most severe at the level of the sigmoid colon. Mild wall thickening with trace pericolonic soft tissue stranding is noted involving the proximal sigmoid colon, image 57/6 and image 142/5. No corresponding intraluminal contrast  extravasation identified within the colon. Lymphatic: No significant vascular findings are present. No enlarged abdominal or pelvic lymph nodes. Reproductive: Uterus and bilateral adnexa are unremarkable. Other: No free fluid or fluid collections Musculoskeletal: No acute or suspicious osseous findings. IMPRESSION: 1. No evidence for active GI bleeding. 2. Extensive colonic diverticulosis with mild wall thickening and trace pericolonic soft tissue stranding involving the proximal sigmoid colon. Findings may reflect early or mild diverticulitis without complications. In a patient presenting with lower GI bleed correlation with colon cancer screening is recommended following resolution of this acute episode. Electronically Signed   By: Kerby Moors M.D.   On: 02/07/2022 05:04    Assessment:   # Acute Diverticulitis - noted on ct  # Hematochezia/Rectal bleeding 2/2 above  # ABLA 2/2 above  Plan:  Continue ciprofloxacin and flagyl Can transition to oral at discharge to complete 10-14 day course Bloody stools slowing down Hgb down to 7.5, likely equilibrating. Pt reports last year upon blood donation it was ~12. Advance diet as tolerated Plan for outpatient colonoscopy in 6-8 weeks. Pt provided contact information Monitor H&H.  Transfusion and resuscitation as per primary team Avoid frequent lab draws to prevent lab induced anemia Supportive care and antiemetics as per primary team Maintain two sites IV access Avoid nsaids Recommend pre-natal vitamin supplementation as outpatient. Can consider isolated iron/b12/folate supplementation after CBC check with PCP in 1 week Monitor for GIB.  GI to sign off. Available as needed.   Should any acute issues arise, Dr. Vicente Males will be covering the inpatient GI service over the weekend if needed.  I personally performed the service.  Management of other medical comorbidities as per primary team  Thank you for allowing [place] to participate in this  patient's care. Please don't hesitate to call if any questions or concerns arise.   Annamaria Helling, DO [facility]  Portions of the record may have been created with voice recognition software. Occasional wrong-word or 'sound-a-like' substitutions may have occurred due to the inherent limitations of voice recognition software.  Read the chart carefully and recognize, using context, where substitutions may have occurred.

## 2022-02-08 NOTE — Progress Notes (Incomplete)
PROGRESS NOTE  Penny Savage    DOB: Jun 13, 1976, 46 y.o.  MVE:[id number]    Code Status: Full Code   DOA: 02/07/2022   LOS: 1   Brief hospital course  Penny Savage is a 46 y.o. female with a PMH significant for ***. They presented from *** to the ED on 02/07/2022 with *** x *** days. *** In the ED, it was found that they had ***.  They were treated with ***.  Patient was admitted to medicine service for further workup and management of *** as outlined in detail below.  02/08/22 -***  Assessment & Plan  Principal Problem:   ABLA (acute blood loss anemia) Active Problems:   GI bleed   Diverticulosis of intestine with bleeding  *** -   *** -   *** -   *** -   *** -  Body mass index is 48.26 kg/m.  VTE ppx: SCDs Start: 02/07/22 0908   Diet:     Diet   Diet clear liquid Room service appropriate? Yes; Fluid consistency: Thin   Subjective 02/08/22    Pt reports ***   Objective   Vitals:   02/07/22 2101 02/07/22 2205 02/08/22 0011 02/08/22 0357  BP: (!) 152/82  (!) 142/70 138/81  Pulse: 77  80 86  Resp: 18 19 18 20   Temp: 97.8 F (36.6 C)  97.9 F (36.6 C) 98.6 F (37 C)  TempSrc: Oral   Oral  SpO2: 100%  100% 100%  Weight:      Height:        Intake/Output Summary (Last 24 hours) at 02/08/2022 02/10/2022 Last data filed at 02/08/2022 0603 Gross per 24 hour  Intake 3180.71 ml  Output --  Net 3180.71 ml   Filed Weights   02/07/22 0207  Weight: 131.5 kg     Physical Exam: *** General: awake, alert, NAD HEENT: atraumatic, clear conjunctiva, anicteric sclera, MMM, hearing grossly normal Respiratory: normal respiratory effort. Cardiovascular: normal S1/S2, RRR, no JVD, murmurs, quick capillary refill  Gastrointestinal: soft, NT, ND Nervous: A&O x3. no gross focal neurologic deficits, normal speech Extremities: moves all equally, no edema, normal tone Skin: dry, intact, normal temperature, normal color. No rashes, lesions or ulcers on exposed  skin Psychiatry: normal mood, congruent affect  Labs   I have personally reviewed the following labs and imaging studies CBC    Component Value Date/Time   WBC 5.8 02/07/2022 0212   RBC 4.00 02/07/2022 0212   HGB 7.5 (L) 02/08/2022 0345   HCT 24.3 (L) 02/08/2022 0345   PLT 395 02/07/2022 0212   MCV 75.8 (L) 02/07/2022 0212   MCH 23.5 (L) 02/07/2022 0212   MCHC 31.0 02/07/2022 0212   RDW 15.2 02/07/2022 0212   LYMPHSABS 1.9 02/07/2022 0212   MONOABS 0.3 02/07/2022 0212   EOSABS 0.2 02/07/2022 0212   BASOSABS 0.0 02/07/2022 0212   BMP Latest Ref Rng & Units 02/08/2022 02/07/2022 12/17/2017  Glucose 70 - 99 mg/dL 12/19/2017) 629(U) 765(Y)  BUN 6 - 20 mg/dL 8 14 11   Creatinine 0.44 - 1.00 mg/dL 650(P ) 5.46  Sodium 135 - 145 mmol/L 137 137 137  Potassium 3.5 - 5.1 mmol/L 3.5 3.6 3.4(L)  Chloride 98 - 111 mmol/L 109 108 103  CO2 22 - 32 mmol/L 24 24 24   Calcium 8.9 - 10.3 mg/dL 8.2(L) 8.4(L) 9.1    DG Chest Port 1 View  Result Date: 02/07/2022 CLINICAL DATA:  Rectal bleeding EXAM: PORTABLE CHEST 1 VIEW COMPARISON:  None. FINDINGS: Lungs are clear.  No pleural effusion or pneumothorax. The heart is top-normal in size. IMPRESSION: No evidence of acute cardiopulmonary disease. Electronically Signed   By: Charline Bills M.D.   On: 02/07/2022 03:49   CT ANGIO GI BLEED  Result Date: 02/07/2022 CLINICAL DATA:  Rectal bleeding. EXAM: CTA ABDOMEN AND PELVIS WITHOUT AND WITH CONTRAST TECHNIQUE: Multidetector CT imaging of the abdomen and pelvis was performed using the standard protocol during bolus administration of intravenous contrast. Multiplanar reconstructed images and MIPs were obtained and reviewed to evaluate the vascular anatomy. RADIATION DOSE REDUCTION: This exam was performed according to the departmental dose-optimization program which includes automated exposure control, adjustment of the mA and/or kV according to patient size and/or use of iterative reconstruction technique.  CONTRAST:  OMNIPAQUE IOHEXOL 350 MG/ML SOLN COMPARISON:  12/17/2017 FINDINGS: VASCULAR Aorta: Normal caliber aorta without aneurysm, dissection, vasculitis or significant stenosis. Celiac: Patent without evidence of aneurysm, dissection, vasculitis or significant stenosis. SMA: Patent without evidence of aneurysm, dissection, vasculitis or significant stenosis. Renals: Both renal arteries are patent without evidence of aneurysm, dissection, vasculitis, fibromuscular dysplasia or significant stenosis. IMA: Patent without evidence of aneurysm, dissection, vasculitis or significant stenosis. Inflow: Patent without evidence of aneurysm, dissection, vasculitis or significant stenosis. Proximal Outflow: Bilateral common femoral and visualized portions of the superficial and profunda femoral arteries are patent without evidence of aneurysm, dissection, vasculitis or significant stenosis. Veins: No obvious venous abnormality within the limitations of this arterial phase study. Review of the MIP images confirms the above findings. NON-VASCULAR Lower chest: No acute abnormality. Hepatobiliary: No focal liver abnormality is seen. No gallstones, gallbladder wall thickening, or biliary dilatation. Pancreas: Unremarkable. No pancreatic ductal dilatation or surrounding inflammatory changes. Spleen: Normal in size without focal abnormality. Adrenals/Urinary Tract: Normal adrenal glands. No kidney mass or hydronephrosis. Urinary bladder is unremarkable. Stomach/Bowel: Stomach appears normal. No abnormal areas of intraluminal contrast extravasation within the small bowel loops. There is extensive colonic diverticulosis which is most severe at the level of the sigmoid colon. Mild wall thickening with trace pericolonic soft tissue stranding is noted involving the proximal sigmoid colon, image 57/6 and image 142/5. No corresponding intraluminal contrast extravasation identified within the colon. Lymphatic: No significant vascular  findings are present. No enlarged abdominal or pelvic lymph nodes. Reproductive: Uterus and bilateral adnexa are unremarkable. Other: No free fluid or fluid collections Musculoskeletal: No acute or suspicious osseous findings. IMPRESSION: 1. No evidence for active GI bleeding. 2. Extensive colonic diverticulosis with mild wall thickening and trace pericolonic soft tissue stranding involving the proximal sigmoid colon. Findings may reflect early or mild diverticulitis without complications. In a patient presenting with lower GI bleed correlation with colon cancer screening is recommended following resolution of this acute episode. Electronically Signed   By: Signa Kell M.D.   On: 02/07/2022 05:04    Disposition Plan & Communication  Patient status: Observation  Admitted From: {From:23814} Planned disposition location: {PLAN; DISPOSITION:26386} Anticipated discharge date: *** pending ***  Family Communication: ***    Author: Leeroy Bock, DO [facility] 02/08/2022, 7:22 AM   Available by Epic secure chat 7AM-7PM. If 7PM-7AM, please contact night-coverage.  [facility] contact information found on [email or web address].

## 2022-02-08 NOTE — Discharge Summary (Signed)
Physician Discharge Summary  Patient: Penny Savage [id number] DOB: [birth year]   Code Status: Full Code Admit date: 02/07/2022 Discharge date: 02/08/2022 Disposition: Home, No home health services recommended PCP: Levin Erp, MD  Recommendations for Outpatient Follow-up:  Follow up with PCP within 1 week Recheck CBC- hgb 7.7 on day of discharge Follow up with GI for colonoscopy in 6-8 weeks  Discharge Diagnoses:  Principal Problem:   ABLA (acute blood loss anemia) Active Problems:   GI bleed   Diverticulosis of intestine with bleeding  Brief Hospital Course Summary: Pt Penny Savage is a 46 y.o. female with medical history significant for morbid obesity and diverticular disease who presents to the emergency room for evaluation of multiple episodes of rectal bleeding associated with blood clots. Patient states that symptoms started 1 day prior to hospitalization and that she had gone to the bathroom to void when she had the first episode of rectal bleeding with large blood clots.  She denied having any abdominal pain or discomfort.  She has had several episodes since then.  She denies having any hematemesis or melena and has had no fever or chills. She was found to have self-resolving diverticular bleed on CTA GI with early signs of diverticulitis.  She remained hemodynamically stable. She did not require any blood transfusions as her hgb remained stable above transfusion threshold throughout stay.  Her bleeding resolved spontaneously.  GI was consulted and recommended outpatient follow up in 6-8 weeks for colonoscopy after acute inflammation has resolved.   Discharge Condition: Good, improved Recommended discharge diet: Regular healthy diet  Consultations: GI  Procedures/Studies: CTA GI   Allergies as of 02/08/2022       Reactions   Fish Allergy Itching   Scratchy throat   Penicillins Hives   Has patient had a PCN reaction causing immediate rash,  facial/tongue/throat swelling, SOB or lightheadedness with hypotension: No Has patient had a PCN reaction causing severe rash involving mucus membranes or skin necrosis: No Has patient had a PCN reaction that required hospitalization No Has patient had a PCN reaction occurring within the last 10 years: No If all of the above answers are "NO", then may proceed with Cephalosporin use.        Medication List     STOP taking these medications    OVER THE COUNTER MEDICATION       TAKE these medications    aspirin-acetaminophen-caffeine 250-250-65 MG tablet Commonly known as: EXCEDRIN MIGRAINE Take 1 tablet by mouth every 6 (six) hours as needed for headache.   ciprofloxacin 500 MG tablet Commonly known as: Cipro Take 1 tablet (500 mg total) by mouth 2 (two) times daily for 9 days.   Iron 325 (65 Fe) MG Tabs Take 1 tablet (325 mg total) by mouth every other day.   metroNIDAZOLE 500 MG tablet Commonly known as: Flagyl Take 1 tablet (500 mg total) by mouth 2 (two) times daily for 9 days.         Subjective   Pt reports no more bleeding since admission. She feels overall well. She expresses understanding of follow up plan and return precautions.  Objective  Blood pressure (!) 152/85, pulse 91, temperature 97.7 F (36.5 C), temperature source Oral, resp. rate 18, height 5\' 5"  (1.651 m), weight 131.5 kg, last menstrual period 01/14/2022, SpO2 100 %.   General: Pt is alert, awake, not in acute distress Cardiovascular: RRR, S1/S2 +, no rubs, no gallops Respiratory: CTA bilaterally, no wheezing, no rhonchi Abdominal:  Soft, NT, ND, bowel sounds + Extremities: no edema, no cyanosis   The results of significant diagnostics from this hospitalization (including imaging, microbiology, ancillary and laboratory) are listed below for reference.   Imaging studies: DG Chest Port 1 View  Result Date: 02/07/2022 CLINICAL DATA:  Rectal bleeding EXAM: PORTABLE CHEST 1 VIEW COMPARISON:   None. FINDINGS: Lungs are clear.  No pleural effusion or pneumothorax. The heart is top-normal in size. IMPRESSION: No evidence of acute cardiopulmonary disease. Electronically Signed   By: Julian Hy M.D.   On: 02/07/2022 03:49   CT ANGIO GI BLEED  Result Date: 02/07/2022 CLINICAL DATA:  Rectal bleeding. EXAM: CTA ABDOMEN AND PELVIS WITHOUT AND WITH CONTRAST TECHNIQUE: Multidetector CT imaging of the abdomen and pelvis was performed using the standard protocol during bolus administration of intravenous contrast. Multiplanar reconstructed images and MIPs were obtained and reviewed to evaluate the vascular anatomy. RADIATION DOSE REDUCTION: This exam was performed according to the departmental dose-optimization program which includes automated exposure control, adjustment of the mA and/or kV according to patient size and/or use of iterative reconstruction technique. CONTRAST:  155mL OMNIPAQUE IOHEXOL 350 MG/ML SOLN COMPARISON:  12/17/2017 FINDINGS: VASCULAR Aorta: Normal caliber aorta without aneurysm, dissection, vasculitis or significant stenosis. Celiac: Patent without evidence of aneurysm, dissection, vasculitis or significant stenosis. SMA: Patent without evidence of aneurysm, dissection, vasculitis or significant stenosis. Renals: Both renal arteries are patent without evidence of aneurysm, dissection, vasculitis, fibromuscular dysplasia or significant stenosis. IMA: Patent without evidence of aneurysm, dissection, vasculitis or significant stenosis. Inflow: Patent without evidence of aneurysm, dissection, vasculitis or significant stenosis. Proximal Outflow: Bilateral common femoral and visualized portions of the superficial and profunda femoral arteries are patent without evidence of aneurysm, dissection, vasculitis or significant stenosis. Veins: No obvious venous abnormality within the limitations of this arterial phase study. Review of the MIP images confirms the above findings. NON-VASCULAR  Lower chest: No acute abnormality. Hepatobiliary: No focal liver abnormality is seen. No gallstones, gallbladder wall thickening, or biliary dilatation. Pancreas: Unremarkable. No pancreatic ductal dilatation or surrounding inflammatory changes. Spleen: Normal in size without focal abnormality. Adrenals/Urinary Tract: Normal adrenal glands. No kidney mass or hydronephrosis. Urinary bladder is unremarkable. Stomach/Bowel: Stomach appears normal. No abnormal areas of intraluminal contrast extravasation within the small bowel loops. There is extensive colonic diverticulosis which is most severe at the level of the sigmoid colon. Mild wall thickening with trace pericolonic soft tissue stranding is noted involving the proximal sigmoid colon, image 57/6 and image 142/5. No corresponding intraluminal contrast extravasation identified within the colon. Lymphatic: No significant vascular findings are present. No enlarged abdominal or pelvic lymph nodes. Reproductive: Uterus and bilateral adnexa are unremarkable. Other: No free fluid or fluid collections Musculoskeletal: No acute or suspicious osseous findings. IMPRESSION: 1. No evidence for active GI bleeding. 2. Extensive colonic diverticulosis with mild wall thickening and trace pericolonic soft tissue stranding involving the proximal sigmoid colon. Findings may reflect early or mild diverticulitis without complications. In a patient presenting with lower GI bleed correlation with colon cancer screening is recommended following resolution of this acute episode. Electronically Signed   By: Kerby Moors M.D.   On: 02/07/2022 05:04    Labs: Basic Metabolic Panel: Recent Labs  Lab 02/07/22 0212 02/08/22 0345  NA 137 137  K 3.6 3.5  CL 108 109  CO2 24 24  GLUCOSE 118* 103*  BUN 14 8  CREATININE 1.16* 0.99  CALCIUM 8.4* 8.2*   CBC: Recent Labs  Lab 02/07/22 0212 02/07/22  1641 02/08/22 0345 02/08/22 1553  WBC 5.8  --   --  6.4  NEUTROABS 3.4  --   --    --   HGB 9.4* 8.5* 7.5* 7.7*  HCT 30.3* 27.3* 24.3* 25.2*  MCV 75.8*  --   --  74.6*  PLT 395  --   --  365   Microbiology: none  Time coordinating discharge: Over 30 minutes  Richarda Osmond, MD  [facility] 02/08/2022, 5:24 PM

## 2022-02-08 NOTE — Discharge Summary (Incomplete)
Physician Discharge Summary  Patient: Penny Savage QVZ:[id number] DOB: [birth year]   Code Status: Full Code Admit date: 02/07/2022 Discharge date: 02/08/2022 Disposition: Home, No home health services recommended PCP: Nila Nephew, MD  Recommendations for Outpatient Follow-up:  Follow up with PCP within 1-2 weeks   Discharge Diagnoses:  Principal Problem:   ABLA (acute blood loss anemia) Active Problems:   GI bleed   Diverticulosis of intestine with bleeding  Brief Hospital Course Summary: Pt ***  Discharge Condition: {DISCHARGE CONDITION:19696}, improved Recommended discharge diet: {Discharge PIRJ:188416606}  Consultations: ***  Procedures/Studies: ***   Allergies as of 02/08/2022       Reactions   Fish Allergy Itching   Scratchy throat   Penicillins Hives   Has patient had a PCN reaction causing immediate rash, facial/tongue/throat swelling, SOB or lightheadedness with hypotension: No Has patient had a PCN reaction causing severe rash involving mucus membranes or skin necrosis: No Has patient had a PCN reaction that required hospitalization No Has patient had a PCN reaction occurring within the last 10 years: No If all of the above answers are "NO", then may proceed with Cephalosporin use.     Med Rec must be completed prior to using this SMART[employer]***        Subjective   Pt reports *** Objective  Blood pressure (!) 172/93, pulse 83, temperature 98 F (36.7 C), temperature source Oral, resp. rate 18, height 5\' 5"  (1.651 m), weight 131.5 kg, last menstrual period 01/14/2022, SpO2 100 %.   General: Pt is alert, awake, not in acute distress Cardiovascular: RRR, S1/S2 +, no rubs, no gallops Respiratory: CTA bilaterally, no wheezing, no rhonchi Abdominal: Soft, NT, ND, bowel sounds + Extremities: no edema, no cyanosis   The results of significant diagnostics from this hospitalization (including imaging, microbiology, ancillary and laboratory) are listed  below for reference.   Imaging studies: DG Chest Port 1 View  Result Date: 02/07/2022 CLINICAL DATA:  Rectal bleeding EXAM: PORTABLE CHEST 1 VIEW COMPARISON:  None. FINDINGS: Lungs are clear.  No pleural effusion or pneumothorax. The heart is top-normal in size. IMPRESSION: No evidence of acute cardiopulmonary disease. Electronically Signed   By: 02/09/2022 M.D.   On: 02/07/2022 03:49   CT ANGIO GI BLEED  Result Date: 02/07/2022 CLINICAL DATA:  Rectal bleeding. EXAM: CTA ABDOMEN AND PELVIS WITHOUT AND WITH CONTRAST TECHNIQUE: Multidetector CT imaging of the abdomen and pelvis was performed using the standard protocol during bolus administration of intravenous contrast. Multiplanar reconstructed images and MIPs were obtained and reviewed to evaluate the vascular anatomy. RADIATION DOSE REDUCTION: This exam was performed according to the departmental dose-optimization program which includes automated exposure control, adjustment of the mA and/or kV according to patient size and/or use of iterative reconstruction technique. CONTRAST:  02/09/2022 OMNIPAQUE IOHEXOL 350 MG/ML SOLN COMPARISON:  12/17/2017 FINDINGS: VASCULAR Aorta: Normal caliber aorta without aneurysm, dissection, vasculitis or significant stenosis. Celiac: Patent without evidence of aneurysm, dissection, vasculitis or significant stenosis. SMA: Patent without evidence of aneurysm, dissection, vasculitis or significant stenosis. Renals: Both renal arteries are patent without evidence of aneurysm, dissection, vasculitis, fibromuscular dysplasia or significant stenosis. IMA: Patent without evidence of aneurysm, dissection, vasculitis or significant stenosis. Inflow: Patent without evidence of aneurysm, dissection, vasculitis or significant stenosis. Proximal Outflow: Bilateral common femoral and visualized portions of the superficial and profunda femoral arteries are patent without evidence of aneurysm, dissection, vasculitis or significant  stenosis. Veins: No obvious venous abnormality within the limitations of this arterial phase study.  Review of the MIP images confirms the above findings. NON-VASCULAR Lower chest: No acute abnormality. Hepatobiliary: No focal liver abnormality is seen. No gallstones, gallbladder wall thickening, or biliary dilatation. Pancreas: Unremarkable. No pancreatic ductal dilatation or surrounding inflammatory changes. Spleen: Normal in size without focal abnormality. Adrenals/Urinary Tract: Normal adrenal glands. No kidney mass or hydronephrosis. Urinary bladder is unremarkable. Stomach/Bowel: Stomach appears normal. No abnormal areas of intraluminal contrast extravasation within the small bowel loops. There is extensive colonic diverticulosis which is most severe at the level of the sigmoid colon. Mild wall thickening with trace pericolonic soft tissue stranding is noted involving the proximal sigmoid colon, image 57/6 and image 142/5. No corresponding intraluminal contrast extravasation identified within the colon. Lymphatic: No significant vascular findings are present. No enlarged abdominal or pelvic lymph nodes. Reproductive: Uterus and bilateral adnexa are unremarkable. Other: No free fluid or fluid collections Musculoskeletal: No acute or suspicious osseous findings. IMPRESSION: 1. No evidence for active GI bleeding. 2. Extensive colonic diverticulosis with mild wall thickening and trace pericolonic soft tissue stranding involving the proximal sigmoid colon. Findings may reflect early or mild diverticulitis without complications. In a patient presenting with lower GI bleed correlation with colon cancer screening is recommended following resolution of this acute episode. Electronically Signed   By: Signa Kell M.D.   On: 02/07/2022 05:04    Labs: Basic Metabolic Panel: Recent Labs  Lab 02/07/22 0212 02/08/22 0345  NA 137 137  K 3.6 3.5  CL 108 109  CO2 24 24  GLUCOSE 118* 103*  BUN 14 8  CREATININE  1.16* 0.99  CALCIUM 8.4* 8.2*   CBC: Recent Labs  Lab 02/07/22 0212 02/07/22 1641 02/08/22 0345  WBC 5.8  --   --   NEUTROABS 3.4  --   --   HGB 9.4* 8.5* 7.5*  HCT 30.3* 27.3* 24.3*  MCV 75.8*  --   --   PLT 395  --   --    Microbiology: ***  Time coordinating discharge: Over 30 minutes  Leeroy Bock, MD  [facility] 02/08/2022, 2:51 PM

## 2022-02-08 NOTE — Discharge Instructions (Signed)
Your hemoglobin came up to 7.7 Please follow up with your primary doctor within a week to recheck your blood levels to make sure it is staying steady and/or improving.  I have sent a prescription for antibiotics to your pharmacy for an additional 9 days (10 days total including what you got in the hospital already). I also recommend an iron supplement which you can get over the counter. Take with vitamin C for better absorption Please return to hospital if you have significant bleeding or symptoms of severe anemia as described in the reading material here.  GI will schedule with you for a colonoscopy in 6-8 weeks

## 2022-02-09 ENCOUNTER — Other Ambulatory Visit: Payer: Self-pay | Admitting: Student in an Organized Health Care Education/Training Program

## 2022-02-09 MED ORDER — ONDANSETRON HCL 4 MG PO TABS: 4.0000 mg | ORAL_TABLET | Freq: Two times a day (BID) | ORAL | 0 refills | Status: AC | PRN

## 2022-02-09 NOTE — Progress Notes (Signed)
Zofran sent to pharmacy per patient request to treat side effect of prescribed antibiotic from hospitalization

## 2022-02-25 ENCOUNTER — Ambulatory Visit: Payer: 59 | Admitting: Family

## 2022-02-27 ENCOUNTER — Encounter: Payer: Self-pay | Admitting: Family

## 2022-02-27 ENCOUNTER — Other Ambulatory Visit: Payer: Self-pay

## 2022-02-27 ENCOUNTER — Telehealth (INDEPENDENT_AMBULATORY_CARE_PROVIDER_SITE_OTHER): Payer: 59 | Admitting: Family

## 2022-02-27 VITALS — Ht 66.0 in | Wt 287.0 lb

## 2022-02-27 DIAGNOSIS — R030 Elevated blood-pressure reading, without diagnosis of hypertension: Secondary | ICD-10-CM | POA: Diagnosis not present

## 2022-02-27 DIAGNOSIS — R5383 Other fatigue: Secondary | ICD-10-CM | POA: Diagnosis not present

## 2022-02-27 DIAGNOSIS — K922 Gastrointestinal hemorrhage, unspecified: Secondary | ICD-10-CM

## 2022-02-27 DIAGNOSIS — D62 Acute posthemorrhagic anemia: Secondary | ICD-10-CM

## 2022-02-27 DIAGNOSIS — K5791 Diverticulosis of intestine, part unspecified, without perforation or abscess with bleeding: Secondary | ICD-10-CM

## 2022-02-27 NOTE — Progress Notes (Signed)
New patient video Visit  MyChart Video Visit    Virtual Visit via Video Note   This visit type was conducted due to national recommendations for restrictions regarding the COVID-19 Pandemic (e.g. social distancing) in an effort to limit this patient's exposure and mitigate transmission in our community. This patient is at least at moderate risk for complications without adequate follow up. This format is felt to be most appropriate for this patient at this time. Physical exam was limited by quality of the video and audio technology used for the visit. CMA was able to get the patient set up on a video visit.  Patient location: Home. Patient and provider in visit Provider location: Office  I discussed the limitations of evaluation and management by telemedicine and the availability of in person appointments. The patient expressed understanding and agreed to proceed.  Visit Date: 02/27/2022  Today's healthcare provider: Mort Sawyers, FNP   Subjective:  Patient ID: Penny Savage, female    DOB: 04/03/1976  Age: 46 y.o. MRN: [id number]  CC:  Chief Complaint  Patient presents with   [facility] f/u     HPI Penny Savage is here today with concerns and is here to establish care as a new patient.  Last pcp retired, Dr. Nila Nephew.   ER 02/07/22 for rectal bleeding  Hemoglobin 9.4 down from 12.  MCV 75.8 Chest x-ray no evidence of acute cardiopulmonary disease CT angiogram of the abdomen and pelvis shows extensive colonic diverticulosis with mild wall thickening and trace pericolonic soft tissue stranding involving the proximal sigmoid colon.  Findings may reflect early or mild diverticulitis without complication.  Discharge Dx:   Janit Bern (acute blood loss anemia)   GI bleed   Diverticulosis of intestine with bleeding  Per GI consult plan to see pt post hospital visit and arrange outpatient colonscopy in 6-8 weeks Given ciprofloxacin metronidazole and IVF. Informed  to Recommend pre-natal vitamin supplementation as outpatient. Can consider isolated iron/b12/folate supplementation after CBC check with PCP in 1 week Monitor for GIB. Needs repeat cc to monitor hemoglobin, which was last at 7.7.   Pt is concerned with her blood pressure. While in the hospital was pretty high, and thought it may be related to blood loss while in ER. She does not have a blood pressure cuff and did not check it since discharge. Denies cp palp sob blurry vision or headache  Since out of hospital she Is taking daily iron, she started otc iron and tolerating well.  Has appt April 11th with GI for follow up post discharge. No longer with rectal bleeding. On abd pain Pt states not overly tired, but does find she tires easily which is odd for her.  Pt does state she used to have high blood pressure in the past but had resolved, so No longer on medication.    Past Medical History:  Diagnosis Date   Diverticulitis     Past Surgical History:  Procedure Laterality Date   DILATION AND CURETTAGE OF UTERUS  april 2012   FOOT SURGERY  2001    Family History  Problem Relation Age of Onset   Hypertension Mother    Cancer Maternal Aunt        breast   Cancer Paternal Grandmother        breast    Social History   Socioeconomic History   Marital status: Single    Spouse name: Not on file   Number of children:  Not on file   Years of education: Not on file   Highest education level: Not on file  Occupational History   Not on file  Tobacco Use   Smoking status: Never   Smokeless tobacco: Never  Vaping Use   Vaping Use: Never used  Substance and Sexual Activity   Alcohol use: No    Alcohol/week: 0.0 standard drinks   Drug use: No   Sexual activity: Not on file  Other Topics Concern   Not on file  Social History Narrative   Not on file   Social Determinants of Health   Financial Resource Strain: Not on file  Food Insecurity: Not on file  Transportation Needs: Not on  file  Physical Activity: Not on file  Stress: Not on file  Social Connections: Not on file  Intimate Partner Violence: Not on file    Outpatient Medications Prior to Visit  Medication Sig Dispense Refill   aspirin-acetaminophen-caffeine (EXCEDRIN MIGRAINE) 250-250-65 MG tablet Take 1 tablet by mouth every 6 (six) hours as needed for headache.     Ferrous Sulfate (IRON) 325 (65 Fe) MG TABS Take 1 tablet (325 mg total) by mouth every other day. 30 tablet 0   No facility-administered medications prior to visit.    Allergies  Allergen Reactions   Fish Allergy Itching    Scratchy throat   Penicillins Hives    Has patient had a PCN reaction causing immediate rash, facial/tongue/throat swelling, SOB or lightheadedness with hypotension: No Has patient had a PCN reaction causing severe rash involving mucus membranes or skin necrosis: No Has patient had a PCN reaction that required hospitalization No Has patient had a PCN reaction occurring within the last 10 years: No If all of the above answers are "NO", then may proceed with Cephalosporin use.    ROS Review of Systems  Constitutional:  Positive for fatigue (here and there). Negative for chills, fever and unexpected weight change.  Eyes:  Negative for photophobia and visual disturbance.  Respiratory:  Negative for cough, chest tightness, shortness of breath and wheezing.   Cardiovascular:  Negative for chest pain.  Gastrointestinal:  Negative for abdominal distention, abdominal pain, anal bleeding, blood in stool, constipation, diarrhea, nausea, rectal pain and vomiting.  Genitourinary:  Negative for difficulty urinating.  Skin:  Negative for rash.  Neurological:  Negative for dizziness and headaches.     Objective:    Physical Exam Vitals reviewed.  Constitutional:      General: She is not in acute distress.    Appearance: Normal appearance. She is not ill-appearing or toxic-appearing.  HENT:     Mouth/Throat:     Pharynx: No  pharyngeal swelling.     Tonsils: No tonsillar exudate.  Neck:     Thyroid: No thyroid mass.  Pulmonary:     Effort: Pulmonary effort is normal.  Lymphadenopathy:     Cervical:     Right cervical: No superficial cervical adenopathy.    Left cervical: No superficial cervical adenopathy.  Neurological:     General: No focal deficit present.     Mental Status: She is alert and oriented to person, place, and time.  Psychiatric:        Mood and Affect: Mood normal.        Behavior: Behavior normal.        Thought Content: Thought content normal.        Judgment: Judgment normal.    Ht  (1.676 m)    Wt 287  lb (130.2 kg)    LMP 02/07/2022    BMI 46.32 kg/m  Wt Readings from Last 3 Encounters:  02/27/22 287 lb (130.2 kg)  02/07/22 290 lb (131.5 kg)  04/08/16 (!) 315 lb (142.9 kg)     Health Maintenance Due  Topic Date Due   COVID-19 Vaccine (1) Never done   Hepatitis C Screening  Never done   TETANUS/TDAP  Never done   PAP SMEAR-Modifier  Never done   COLONOSCOPY (Pts 45-32yrs Insurance coverage will need to be confirmed)  Never done   INFLUENZA VACCINE  Never done    There are no preventive care reminders to display for this patient.  No results found for: TSH Lab Results  Component Value Date   WBC 6.4 02/08/2022   HGB 7.7 (L) 02/08/2022   HCT 25.2 (L) 02/08/2022   MCV 74.6 (L) 02/08/2022   PLT 365 02/08/2022   Lab Results  Component Value Date   NA 137 02/08/2022   K 3.5 02/08/2022   CO2 24 02/08/2022   GLUCOSE 103 (H) 02/08/2022   BUN 8 02/08/2022   CREATININE 0.99 02/08/2022   BILITOT 0.3 02/07/2022   ALKPHOS 71 02/07/2022   AST 16 02/07/2022   ALT 10 02/07/2022   PROT 7.4 02/07/2022   ALBUMIN 3.7 02/07/2022   CALCIUM 8.2 (L) 02/08/2022   ANIONGAP 4 (L) 02/08/2022   No results found for: HGBA1C    Assessment & Plan:   Problem List Items Addressed This Visit       Digestive   GI bleed    Repeat cbc to monitor stable hgn  Pt with f/u gi in  April for colonoscopy Pt to let me know if blood in stool again       Diverticulosis of intestine with bleeding    Completed antbx denies any abd pain has f/u with GI in April  Likely colonoscopy        Other   ABLA (acute blood loss anemia)    Repeat cbc today  Pending results Continue iron      Relevant Orders   CBC with Differential   Elevated blood pressure reading - Primary    Pt advised of the following:  Continue medication as prescribed. Monitor blood pressure daily. Goal is <130/90 on average. Ensure that you have rested for 30 minutes prior to checking your blood pressure. Record your readings and bring them to your next visit if necessary.work on a low sodium diet.       Relevant Orders   Comprehensive metabolic panel   Fatigue    Cbc  cmp  tsh ordered       Relevant Orders   CBC with Differential   TSH    No orders of the defined types were placed in this encounter.   Follow-up: No follow-ups on file.  I discussed the assessment and treatment plan with the patient. The patient was provided an opportunity to ask questions and all were answered. The patient agreed with the plan and demonstrated an understanding of the instructions.   The patient was advised to call back or seek an in-person evaluation if the symptoms worsen or if the condition fails to improve as anticipated.  I provided 35 minutes of face-to-face time during this encounter.   Mort Sawyers, FNP

## 2022-02-27 NOTE — Assessment & Plan Note (Signed)
Pt advised of the following:  ?Continue medication as prescribed. Monitor blood pressure daily. Goal is <130/90 on average. Ensure that you have rested for 30 minutes prior to checking your blood pressure. Record your readings and bring them to your next visit if necessary.work on a low sodium diet. ? ?

## 2022-02-27 NOTE — Assessment & Plan Note (Signed)
Repeat cbc today  ?Pending results ?Continue iron ?

## 2022-02-27 NOTE — Assessment & Plan Note (Signed)
Cbc  ?cmp  ?tsh ordered  ?

## 2022-02-27 NOTE — Assessment & Plan Note (Signed)
Repeat cbc to monitor stable hgn  ?Pt with f/u gi in April for colonoscopy ?Pt to let me know if blood in stool again  ?

## 2022-02-27 NOTE — Assessment & Plan Note (Signed)
Completed antbx denies any abd pain has f/u with GI in April  ?Likely colonoscopy ?

## 2022-03-11 ENCOUNTER — Ambulatory Visit (INDEPENDENT_AMBULATORY_CARE_PROVIDER_SITE_OTHER): Payer: 59 | Admitting: Family

## 2022-03-11 ENCOUNTER — Encounter: Payer: Self-pay | Admitting: Family

## 2022-03-11 ENCOUNTER — Other Ambulatory Visit: Payer: Self-pay

## 2022-03-11 VITALS — BP 144/98 | HR 88 | Temp 98.6°F | Ht 66.0 in | Wt 292.0 lb

## 2022-03-11 DIAGNOSIS — E049 Nontoxic goiter, unspecified: Secondary | ICD-10-CM

## 2022-03-11 DIAGNOSIS — I10 Essential (primary) hypertension: Secondary | ICD-10-CM | POA: Diagnosis not present

## 2022-03-11 DIAGNOSIS — D62 Acute posthemorrhagic anemia: Secondary | ICD-10-CM | POA: Diagnosis not present

## 2022-03-11 DIAGNOSIS — K5730 Diverticulosis of large intestine without perforation or abscess without bleeding: Secondary | ICD-10-CM

## 2022-03-11 DIAGNOSIS — R030 Elevated blood-pressure reading, without diagnosis of hypertension: Secondary | ICD-10-CM

## 2022-03-11 DIAGNOSIS — R5383 Other fatigue: Secondary | ICD-10-CM | POA: Diagnosis not present

## 2022-03-11 HISTORY — DX: Essential (primary) hypertension: I10

## 2022-03-11 LAB — COMPREHENSIVE METABOLIC PANEL
ALT: 9 U/L (ref 0–35)
AST: 13 U/L (ref 0–37)
Albumin: 3.8 g/dL (ref 3.5–5.2)
Alkaline Phosphatase: 67 U/L (ref 39–117)
BUN: 9 mg/dL (ref 6–23)
CO2: 27 mEq/L (ref 19–32)
Calcium: 8.9 mg/dL (ref 8.4–10.5)
Chloride: 105 mEq/L (ref 96–112)
Creatinine, Ser: 1.01 mg/dL (ref 0.40–1.20)
GFR: 67.05 mL/min (ref >60.00)
Glucose, Bld: 87 mg/dL (ref 70–99)
Potassium: 3.9 mEq/L (ref 3.5–5.1)
Sodium: 139 mEq/L (ref 135–145)
Total Bilirubin: 0.2 mg/dL (ref 0.2–1.2)
Total Protein: 6.7 g/dL (ref 6.0–8.3)

## 2022-03-11 LAB — TSH: TSH: 2.61 u[IU]/mL (ref 0.35–5.50)

## 2022-03-11 LAB — CBC WITH DIFFERENTIAL/PLATELET
Basophils Absolute: 0 10*3/uL (ref 0.0–0.1)
Basophils Relative: 0.3 % (ref 0.0–3.0)
Eosinophils Absolute: 0.1 10*3/uL (ref 0.0–0.7)
Eosinophils Relative: 3.6 % (ref 0.0–5.0)
HCT: 28.4 % — ABNORMAL LOW (ref 36.0–46.0)
Hemoglobin: 8.9 g/dL — ABNORMAL LOW (ref 12.0–15.0)
Lymphocytes Relative: 27.7 % (ref 12.0–46.0)
Lymphs Abs: 1 10*3/uL (ref 0.7–4.0)
MCHC: 31.4 g/dL (ref 30.0–36.0)
MCV: 72.3 fl — ABNORMAL LOW (ref 78.0–100.0)
Monocytes Absolute: 0.3 10*3/uL (ref 0.1–1.0)
Monocytes Relative: 9.1 % (ref 3.0–12.0)
Neutro Abs: 2.2 10*3/uL (ref 1.4–7.7)
Neutrophils Relative %: 59.3 % (ref 43.0–77.0)
Platelets: 396 10*3/uL (ref 150.0–400.0)
RBC: 3.93 Mil/uL (ref 3.87–5.11)
RDW: 17.6 % — ABNORMAL HIGH (ref 11.5–15.5)
WBC: 3.7 10*3/uL — ABNORMAL LOW (ref 4.0–10.5)

## 2022-03-11 LAB — T3, FREE: T3, Free: 3.6 pg/mL (ref 2.3–4.2)

## 2022-03-11 LAB — T4, FREE: Free T4: 0.89 ng/dL (ref 0.60–1.60)

## 2022-03-11 MED ORDER — AMLODIPINE BESYLATE 5 MG PO TABS: 5.0000 mg | ORAL_TABLET | Freq: Every day | ORAL | 2 refills | Status: DC

## 2022-03-11 NOTE — Assessment & Plan Note (Signed)
Patient to follow-up with GI as scheduled referral or an appointment already in place.  If rectal bleeding is to restart please let me know immediately ?

## 2022-03-11 NOTE — Progress Notes (Signed)
? ?Established Patient Office Visit ? ?Subjective:  ?Patient ID: Penny Savage, female    DOB: August 28, 1976  Age: 46 y.o. MRN: [id number] ? ?CC:  ?Chief Complaint  ?Patient presents with  ? Hypertension  ?  Has been checking readings at home but not sure if cuff is working   ? ? ?HPI ?[name] is here today for follow up.  ?Pt is without acute concerns. ? ?Elevated blood pressure readings in hospitals and at some office visits: pt bought blood pressure cuff offline. Not sure how well it is working, she forgot to bring it along with her today. No headaches but did have some last week (but also with menses at the time), no blurry vision, no cp palp and or sob. Readings at home have been around 138/90 and or 145/98.  ? ?Anemia: no more rectal bleeding however will repeat today.  ? ?Diverticulosis: f/u with GI is April 11th. No abdominal pain, no bleeding in stool that she has seen since.  ? ?Still with some fatigue.  ? ?Past Medical History:  ?Diagnosis Date  ? Diverticulitis   ? GI bleed 02/07/2022  ? Primary hypertension 03/11/2022  ? ? ?Past Surgical History:  ?Procedure Laterality Date  ? DILATION AND CURETTAGE OF UTERUS  april 2012  ? FOOT SURGERY  2001  ? ? ?Family History  ?Problem Relation Age of Onset  ? Hypertension Mother   ? Cancer Maternal Aunt   ?     breast  ? Cancer Paternal Grandmother   ?     breast  ? ? ?Social History  ? ?Socioeconomic History  ? Marital status: Single  ?  Spouse name: Not on file  ? Number of children: Not on file  ? Years of education: Not on file  ? Highest education level: Not on file  ?Occupational History  ? Not on file  ?Tobacco Use  ? Smoking status: Never  ? Smokeless tobacco: Never  ?Vaping Use  ? Vaping Use: Never used  ?Substance and Sexual Activity  ? Alcohol use: No  ?  Alcohol/week: 0.0 standard drinks  ? Drug use: No  ? Sexual activity: Not on file  ?Other Topics Concern  ? Not on file  ?Social History Narrative  ? Not on file  ? ?Social Determinants of Health   ? ?Financial Resource Strain: Not on file  ?Food Insecurity: Not on file  ?Transportation Needs: Not on file  ?Physical Activity: Not on file  ?Stress: Not on file  ?Social Connections: Not on file  ?Intimate Partner Violence: Not on file  ? ? ?Outpatient Medications Prior to Visit  ?Medication Sig Dispense Refill  ? aspirin-acetaminophen-caffeine (EXCEDRIN MIGRAINE) 250-250-65 MG tablet Take 1 tablet by mouth every 6 (six) hours as needed for headache.    ? Ferrous Sulfate (IRON) 325 (65 Fe) MG TABS Take 1 tablet (325 mg total) by mouth every other day. 30 tablet 0  ? ?No facility-administered medications prior to visit.  ? ? ?Allergies  ?Allergen Reactions  ? Fish Allergy Itching  ?  Scratchy throat  ? Penicillins Hives  ?  Has patient had a PCN reaction causing immediate rash, facial/tongue/throat swelling, SOB or lightheadedness with hypotension: No ?Has patient had a PCN reaction causing severe rash involving mucus membranes or skin necrosis: No ?Has patient had a PCN reaction that required hospitalization No ?Has patient had a PCN reaction occurring within the last 10 years: No ?If all of the above answers are "NO", then  may proceed with Cephalosporin use.  ? ? ?ROS ?Review of Systems  ?Constitutional:  Positive for fatigue. Negative for chills and fever.  ?Eyes:  Negative for visual disturbance.  ?Respiratory:  Negative for cough, chest tightness and shortness of breath.   ?Cardiovascular:  Negative for chest pain and leg swelling.  ?Gastrointestinal:  Negative for abdominal pain, blood in stool, constipation, diarrhea, nausea, rectal pain and vomiting.  ?Genitourinary:  Negative for difficulty urinating.  ?Neurological:  Negative for dizziness, weakness, light-headedness, numbness and headaches.  ?Psychiatric/Behavioral:  Negative for agitation and sleep disturbance.   ?All other systems reviewed and are negative. ? ? ? ?  ?Objective:  ?  ?Physical Exam ?Vitals reviewed.  ?Constitutional:   ?   General: She  is not in acute distress. ?   Appearance: Normal appearance. She is obese. She is not ill-appearing, toxic-appearing or diaphoretic.  ?HENT:  ?   Mouth/Throat:  ?   Pharynx: No pharyngeal swelling.  ?   Tonsils: No tonsillar exudate.  ?Neck:  ?   Thyroid: No thyroid mass.  ?Cardiovascular:  ?   Rate and Rhythm: Normal rate and regular rhythm.  ?Pulmonary:  ?   Effort: Pulmonary effort is normal.  ?   Breath sounds: Normal breath sounds.  ?Abdominal:  ?   General: Abdomen is flat. Bowel sounds are normal.  ?   Palpations: Abdomen is soft.  ?Musculoskeletal:     ?   General: Normal range of motion.  ?Lymphadenopathy:  ?   Cervical:  ?   Right cervical: No superficial cervical adenopathy. ?   Left cervical: No superficial cervical adenopathy.  ?Skin: ?   General: Skin is warm.  ?   Capillary Refill: Capillary refill takes less than 2 seconds.  ?Neurological:  ?   General: No focal deficit present.  ?   Mental Status: She is alert and oriented to person, place, and time.  ?Psychiatric:     ?   Mood and Affect: Mood normal.     ?   Behavior: Behavior normal.     ?   Thought Content: Thought content normal.     ?   Judgment: Judgment normal.  ? ? ? ? ?BP (!) 144/98   Pulse 88   Temp 98.6 ?F (37 ?C) (Oral)   Ht 5\' 6"  (1.676 m)   Wt 292 lb (132.5 kg)   SpO2 100%   BMI 47.13 kg/m?  ?Wt Readings from Last 3 Encounters:  ?03/11/22 292 lb (132.5 kg)  ?02/27/22 287 lb (130.2 kg)  ?02/07/22 290 lb (131.5 kg)  ? ? ? ?Health Maintenance Due  ?Topic Date Due  ? COVID-19 Vaccine (1) Never done  ? Hepatitis C Screening  Never done  ? TETANUS/TDAP  Never done  ? PAP SMEAR-Modifier  Never done  ? COLONOSCOPY (Pts 45-29yrs Insurance coverage will need to be confirmed)  Never done  ? ? ?There are no preventive care reminders to display for this patient. ? ?No results found for: TSH ?Lab Results  ?Component Value Date  ? WBC 6.4 02/08/2022  ? HGB 7.7 (L) 02/08/2022  ? HCT 25.2 (L) 02/08/2022  ? MCV 74.6 (L) 02/08/2022  ? PLT 365  02/08/2022  ? ?Lab Results  ?Component Value Date  ? NA 137 02/08/2022  ? K 3.5 02/08/2022  ? CO2 24 02/08/2022  ? GLUCOSE 103 (H) 02/08/2022  ? BUN 8 02/08/2022  ? CREATININE 0.99 02/08/2022  ? BILITOT 0.3 02/07/2022  ? ALKPHOS 71 02/07/2022  ?  AST 16 02/07/2022  ? ALT 10 02/07/2022  ? PROT 7.4 02/07/2022  ? ALBUMIN 3.7 02/07/2022  ? CALCIUM 8.2 (L) 02/08/2022  ? ANIONGAP 4 (L) 02/08/2022  ? ?No results found for: CHOL ?No results found for: HDL ?No results found for: [place] ?No results found for: TRIG ?No results found for: CHOLHDL ?No results found for: HGBA1C ? ?  ?Assessment & Plan:  ? ?Problem List Items Addressed This Visit   ? ?  ? Cardiovascular and Mediastinum  ? Primary hypertension  ?  At this point I have decided to start amlodipine 5 mg that patient will take once daily.  Have sent in the Rx for amlodipine 5 mg 1 p.o. daily ? ?Pt advised of the following:  ?Continue medication as prescribed. Monitor blood pressure periodically and/or when you feel symptomatic. Goal is <130/90 on average. Ensure that you have rested for 30 minutes prior to checking your blood pressure. Record your readings and bring them to your next visit if necessary.work on a low sodium diet. ? ?  ?  ? Relevant Medications  ? amLODipine (NORVASC) 5 MG tablet  ?  ? Endocrine  ? Goiter  ?  Suspected goiter on exam today ordering ultrasound of the thyroid as well as thyroid panel lab work.  Pending results ?  ?  ? Relevant Orders  ? US THYROID  ?  ? Other  ? ABLA (acute blood loss anemia)  ? Fatigue - Primary  ?  Thyroid levels CBC ordered to rule out reasons for fatigue ?Patient also with history of anemia as this could contribute as well ?  ?  ? Relevant Orders  ? Thyroid Peroxidase Antibodies (TPO) (REFL)  ? T3, free  ? T4, free  ? Diverticulosis of large intestine without hemorrhage  ?  Patient to follow-up with GI as scheduled referral or an appointment already in place.  If rectal bleeding is to restart please let me know  immediately ?  ?  ? RESOLVED: Elevated blood pressure reading  ? ? ?Meds ordered this encounter  ?Medications  ? amLODipine (NORVASC) 5 MG tablet  ?  Sig: Take 1 tablet (5 mg total) by mouth daily.  ?  Dispense:  30 table

## 2022-03-11 NOTE — Patient Instructions (Addendum)
Stop by the lab prior to leaving today. I will notify you of your results once received.  ? ?I have ordered imaging for you at [facility] outpatient [facility] for ultrasound thyroid. This order has been sent over for you electronically.  ?Please call [phone] to schedule this appointment. ? ?I recommend you start daily amlodipine 5 mg once daily.  ?Start monitoring your blood pressure daily, around the same time of day, for the next 2-3 weeks.  Ensure that you have rested for 30 minutes prior to checking your blood pressure. Record your readings and bring them to your next visit. ? ?It was a pleasure seeing you today! Please do not hesitate to reach out with any questions and or concerns. ? ?Regards,  ? ?Ryn Peine ?FNP-C ? ?

## 2022-03-11 NOTE — Assessment & Plan Note (Signed)
Thyroid levels CBC ordered to rule out reasons for fatigue ?Patient also with history of anemia as this could contribute as well ?

## 2022-03-11 NOTE — Assessment & Plan Note (Signed)
At this point I have decided to start amlodipine 5 mg that patient will take once daily.  Have sent in the Rx for amlodipine 5 mg 1 p.o. daily ? ?Pt advised of the following:  ?Continue medication as prescribed. Monitor blood pressure periodically and/or when you feel symptomatic. Goal is <130/90 on average. Ensure that you have rested for 30 minutes prior to checking your blood pressure. Record your readings and bring them to your next visit if necessary.work on a low sodium diet. ? ?

## 2022-03-11 NOTE — Assessment & Plan Note (Signed)
Suspected goiter on exam today ordering ultrasound of the thyroid as well as thyroid panel lab work.  Pending results ?

## 2022-03-12 ENCOUNTER — Encounter: Payer: Self-pay | Admitting: Family

## 2022-03-12 LAB — THYROID PEROXIDASE ANTIBODIES (TPO) (REFL): Thyroperoxidase Ab SerPl-aCnc: 209 IU/mL — ABNORMAL HIGH (ref <9)

## 2022-03-13 ENCOUNTER — Other Ambulatory Visit: Payer: Self-pay | Admitting: Family

## 2022-03-13 DIAGNOSIS — I10 Essential (primary) hypertension: Secondary | ICD-10-CM

## 2022-03-13 MED ORDER — LOSARTAN POTASSIUM 50 MG PO TABS: 50.0000 mg | ORAL_TABLET | Freq: Every day | ORAL | 0 refills | Status: DC

## 2022-03-14 ENCOUNTER — Other Ambulatory Visit: Payer: Self-pay

## 2022-03-14 ENCOUNTER — Ambulatory Visit
Admission: RE | Admit: 2022-03-14 | Discharge: 2022-03-14 | Disposition: A | Discharge status: Home / Self Care | Payer: 59 | Source: Ambulatory Visit | Attending: Family | Admitting: Family

## 2022-03-14 DIAGNOSIS — E049 Nontoxic goiter, unspecified: Secondary | ICD-10-CM | POA: Insufficient documentation

## 2022-04-11 ENCOUNTER — Encounter: Payer: Self-pay | Admitting: Family

## 2022-04-11 ENCOUNTER — Ambulatory Visit (INDEPENDENT_AMBULATORY_CARE_PROVIDER_SITE_OTHER): Payer: 59 | Admitting: Family

## 2022-04-11 VITALS — BP 142/92 | HR 85 | Temp 98.7°F | Resp 16 | Ht 66.0 in | Wt 289.5 lb

## 2022-04-11 DIAGNOSIS — D72819 Decreased white blood cell count, unspecified: Secondary | ICD-10-CM | POA: Diagnosis not present

## 2022-04-11 DIAGNOSIS — E063 Autoimmune thyroiditis: Secondary | ICD-10-CM | POA: Diagnosis not present

## 2022-04-11 DIAGNOSIS — I10 Essential (primary) hypertension: Secondary | ICD-10-CM | POA: Diagnosis not present

## 2022-04-11 LAB — CBC WITH DIFFERENTIAL/PLATELET
Basophils Absolute: 0 10*3/uL (ref 0.0–0.1)
Basophils Relative: 0.8 % (ref 0.0–3.0)
Eosinophils Absolute: 0.1 10*3/uL (ref 0.0–0.7)
Eosinophils Relative: 2.7 % (ref 0.0–5.0)
HCT: 26.7 % — ABNORMAL LOW (ref 36.0–46.0)
Hemoglobin: 8.3 g/dL — ABNORMAL LOW (ref 12.0–15.0)
Lymphocytes Relative: 24 % (ref 12.0–46.0)
Lymphs Abs: 1.2 10*3/uL (ref 0.7–4.0)
MCHC: 31.3 g/dL (ref 30.0–36.0)
MCV: 70.4 fl — ABNORMAL LOW (ref 78.0–100.0)
Monocytes Absolute: 0.4 10*3/uL (ref 0.1–1.0)
Monocytes Relative: 7.5 % (ref 3.0–12.0)
Neutro Abs: 3.2 10*3/uL (ref 1.4–7.7)
Neutrophils Relative %: 65 % (ref 43.0–77.0)
Platelets: 386 10*3/uL (ref 150.0–400.0)
RBC: 3.79 Mil/uL — ABNORMAL LOW (ref 3.87–5.11)
RDW: 18.2 % — ABNORMAL HIGH (ref 11.5–15.5)
WBC: 4.9 10*3/uL (ref 4.0–10.5)

## 2022-04-11 LAB — MICROALBUMIN / CREATININE URINE RATIO
Creatinine,U: 66.1 mg/dL
Microalb Creat Ratio: 1.5 mg/g (ref 0.0–30.0)
Microalb, Ur: 1 mg/dL (ref 0.0–1.9)

## 2022-04-11 MED ORDER — AMLODIPINE BESYLATE 5 MG PO TABS: 5.0000 mg | ORAL_TABLET | Freq: Every day | ORAL | 1 refills | Status: DC

## 2022-04-11 MED ORDER — LOSARTAN POTASSIUM 100 MG PO TABS: 100.0000 mg | ORAL_TABLET | Freq: Every day | ORAL | 1 refills | Status: DC

## 2022-04-11 NOTE — Assessment & Plan Note (Signed)
Increase losartan to 100 mg.  ?Restart xyzal, if still with headaches may consider amlodipine 5 mg instead of losartan.  ?Start monitoring your blood pressure daily, around the same time of day, for the next 2-3 weeks.  Ensure that you have rested for 30 minutes prior to checking your blood pressure. Record your readings and bring them to your next visit. ? ?

## 2022-04-11 NOTE — Patient Instructions (Addendum)
Increase losartan to 100 mg nightly, (take two of your 50 mg tablets for total of 100 mg at one time until you run out of 50 mg) ? ?Restart xyzal nightly.  ?Can consider flonase daily if you need to as well for allergies.  ? ?Stop by the lab prior to leaving today. I will notify you of your results once received.  ? ?Due to recent changes in healthcare laws, you may see results of your imaging and/or laboratory studies on MyChart before I have had a chance to review them.  I understand that in some cases there may be results that are confusing or concerning to you. Please understand that not all results are received at the same time and often I may need to interpret multiple results in order to provide you with the best plan of care or course of treatment. Therefore, I ask that you please give me 2 business days to thoroughly review all your results before contacting my office for clarification. Should we see a critical lab result, you will be contacted sooner.  ? ?It was a pleasure seeing you today! Please do not hesitate to reach out with any questions and or concerns. ? ?Regards,  ? ?Larissa Pegg ?FNP-C ? ? ?

## 2022-04-11 NOTE — Assessment & Plan Note (Signed)
Repeat cbc pending results 

## 2022-04-11 NOTE — Assessment & Plan Note (Signed)
Reviewed tsh, stable.  ?Continue to monitor.  ?Reviewed u/s thyroid ?

## 2022-04-11 NOTE — Progress Notes (Signed)
? ?Established Patient Office Visit ? ?Subjective:  ?Patient ID: Penny SanerNatalie N Savage, female    DOB: 12/11/1976  Age: 46 y.o. MRN: [id number] ? ?CC:  ?Chief Complaint  ?Patient presents with  ? Hypertension  ? Thyroid Problem  ? ? ?HPI ?Penny Savage is here today with concerns.  ? ?HTN: averaging around 140/80-90 at home. Denies cp palp or sob. Wakes up with a headache very morning. She is drinking a good amount of water.  ? ?Hashimotos thyroid: recent finding. TSH stable. U/s thyroid with heterogenicity which is expected for hashimotos. ?Lab Results  ?Component Value Date  ? TSH 2.61 03/11/2022  ? ? ?Allergic symptoms; not taking xyzal because she wasn't sure if it would interact with losartan. She does have sinus headaches.  ?  ?Past Medical History:  ?Diagnosis Date  ? Diverticulitis   ? GI bleed 02/07/2022  ? Primary hypertension 03/11/2022  ? ? ?Past Surgical History:  ?Procedure Laterality Date  ? DILATION AND CURETTAGE OF UTERUS  april 2012  ? FOOT SURGERY  2001  ? ? ?Family History  ?Problem Relation Age of Onset  ? Hypertension Mother   ? Cancer Maternal Aunt   ?     breast  ? Cancer Paternal Grandmother   ?     breast  ? ? ?Social History  ? ?Socioeconomic History  ? Marital status: Single  ?  Spouse name: Not on file  ? Number of children: Not on file  ? Years of education: Not on file  ? Highest education level: Not on file  ?Occupational History  ? Not on file  ?Tobacco Use  ? Smoking status: Never  ? Smokeless tobacco: Never  ?Vaping Use  ? Vaping Use: Never used  ?Substance and Sexual Activity  ? Alcohol use: No  ?  Alcohol/week: 0.0 standard drinks  ? Drug use: No  ? Sexual activity: Not on file  ?Other Topics Concern  ? Not on file  ?Social History Narrative  ? Not on file  ? ?Social Determinants of Health  ? ?Financial Resource Strain: Not on file  ?Food Insecurity: Not on file  ?Transportation Needs: Not on file  ?Physical Activity: Not on file  ?Stress: Not on file  ?Social Connections: Not on file   ?Intimate Partner Violence: Not on file  ? ? ?Outpatient Medications Prior to Visit  ?Medication Sig Dispense Refill  ? Ferrous Sulfate (IRON) 325 (65 Fe) MG TABS Take 1 tablet (325 mg total) by mouth every other day. 30 tablet 0  ? losartan (COZAAR) 50 MG tablet Take 1 tablet (50 mg total) by mouth daily. 90 tablet 0  ? aspirin-acetaminophen-caffeine (EXCEDRIN MIGRAINE) 250-250-65 MG tablet Take 1 tablet by mouth every 6 (six) hours as needed for headache.    ? ?No facility-administered medications prior to visit.  ? ? ?Allergies  ?Allergen Reactions  ? Fish Allergy Itching  ?  Scratchy throat  ? Penicillins Hives  ?  Has patient had a PCN reaction causing immediate rash, facial/tongue/throat swelling, SOB or lightheadedness with hypotension: No ?Has patient had a PCN reaction causing severe rash involving mucus membranes or skin necrosis: No ?Has patient had a PCN reaction that required hospitalization No ?Has patient had a PCN reaction occurring within the last 10 years: No ?If all of the above answers are "NO", then may proceed with Cephalosporin use.  ? ? ?ROS ?Review of Systems  ?Constitutional:  Negative for chills, fatigue, fever and unexpected weight change.  ?HENT:  Positive for postnasal drip and sinus pressure (here and there). Negative for sneezing.   ?Eyes:  Positive for itching. Negative for visual disturbance.  ?Respiratory:  Negative for shortness of breath.   ?Cardiovascular:  Negative for chest pain.  ?Gastrointestinal:  Negative for abdominal pain.  ?Genitourinary:  Negative for difficulty urinating.  ?Skin:  Negative for rash.  ?Neurological:  Negative for dizziness and headaches.  ? ?  ?Objective:  ?  ?Physical Exam ?Vitals reviewed.  ?Constitutional:   ?   General: She is not in acute distress. ?   Appearance: Normal appearance. She is obese. She is not ill-appearing or toxic-appearing.  ?HENT:  ?   Mouth/Throat:  ?   Pharynx: No pharyngeal swelling.  ?   Tonsils: No tonsillar exudate.   ?Neck:  ?   Thyroid: No thyroid mass.  ?Cardiovascular:  ?   Rate and Rhythm: Normal rate and regular rhythm.  ?Pulmonary:  ?   Effort: Pulmonary effort is normal.  ?   Breath sounds: Normal breath sounds.  ?Lymphadenopathy:  ?   Cervical:  ?   Right cervical: No superficial cervical adenopathy. ?   Left cervical: No superficial cervical adenopathy.  ?Neurological:  ?   General: No focal deficit present.  ?   Mental Status: She is alert and oriented to person, place, and time.  ?Psychiatric:     ?   Mood and Affect: Mood normal.     ?   Behavior: Behavior normal.     ?   Thought Content: Thought content normal.     ?   Judgment: Judgment normal.  ? ? ?BP (!) 142/92   Pulse 85   Temp 98.7 ?F (37.1 ?C)   Resp 16   Ht 5\' 6"  (1.676 m)   Wt 289 lb 8 oz (131.3 kg)   LMP 04/02/2022   SpO2 99%   BMI 46.73 kg/m?  ?Wt Readings from Last 3 Encounters:  ?04/11/22 289 lb 8 oz (131.3 kg)  ?03/11/22 292 lb (132.5 kg)  ?02/27/22 287 lb (130.2 kg)  ? ? ? ?Health Maintenance Due  ?Topic Date Due  ? COVID-19 Vaccine (1) Never done  ? Hepatitis C Screening  Never done  ? TETANUS/TDAP  Never done  ? PAP SMEAR-Modifier  Never done  ? COLONOSCOPY (Pts 45-18yrs Insurance coverage will need to be confirmed)  Never done  ? ? ?There are no preventive care reminders to display for this patient. ? ?Lab Results  ?Component Value Date  ? TSH 2.61 03/11/2022  ? ?Lab Results  ?Component Value Date  ? WBC 3.7 (L) 03/11/2022  ? HGB 8.9 Repeated and verified X2. (L) 03/11/2022  ? HCT 28.4 (L) 03/11/2022  ? MCV 72.3 Repeated and verified X2. (L) 03/11/2022  ? PLT 396.0 03/11/2022  ? ?Lab Results  ?Component Value Date  ? NA 139 03/11/2022  ? K 3.9 03/11/2022  ? CO2 27 03/11/2022  ? GLUCOSE 87 03/11/2022  ? BUN 9 03/11/2022  ? CREATININE 1.01 03/11/2022  ? BILITOT 0.2 03/11/2022  ? ALKPHOS 67 03/11/2022  ? AST 13 03/11/2022  ? ALT 9 03/11/2022  ? PROT 6.7 03/11/2022  ? ALBUMIN 3.8 03/11/2022  ? CALCIUM 8.9 03/11/2022  ? ANIONGAP 4 (L)  02/08/2022  ? GFR 67.05 03/11/2022  ? ?No results found for: HGBA1C ? ?  ?Assessment & Plan:  ? ?Problem List Items Addressed This Visit   ? ?  ? Cardiovascular and Mediastinum  ? Primary hypertension  ?  Increase losartan to 100 mg.  ?Restart xyzal, if still with headaches may consider amlodipine 5 mg instead of losartan.  ?Start monitoring your blood pressure daily, around the same time of day, for the next 2-3 weeks.  Ensure that you have rested for 30 minutes prior to checking your blood pressure. Record your readings and bring them to your next visit. ? ? ?  ?  ? Relevant Medications  ? losartan (COZAAR) 100 MG tablet  ? Other Relevant Orders  ? Microalbumin / creatinine urine ratio  ?  ? Endocrine  ? Hashimoto's disease  ?  Reviewed tsh, stable.  ?Continue to monitor.  ?Reviewed u/s thyroid ? ?  ?  ?  ? Other  ? Leukopenia - Primary  ?  Repeat cbc pending results ? ?  ?  ? Relevant Orders  ? CBC with Differential  ? ? ?Meds ordered this encounter  ?Medications  ? DISCONTD: amLODipine (NORVASC) 5 MG tablet  ?  Sig: Take 1 tablet (5 mg total) by mouth daily.  ?  Dispense:  90 tablet  ?  Refill:  1  ?  Order Specific Question:   Supervising Provider  ?  Answer:   BEDSOLE, AMY E [[id number]]  ? losartan (COZAAR) 100 MG tablet  ?  Sig: Take 1 tablet (100 mg total) by mouth daily.  ?  Dispense:  90 tablet  ?  Refill:  1  ?  Order Specific Question:   Supervising Provider  ?  Answer:   BEDSOLE, AMY E [[id number]]  ? ? ?Follow-up: Return in about 3 months (around 07/11/2022) for with blood pressure log.  ? ? ?Mort Sawyers, FNP ?

## 2022-04-15 ENCOUNTER — Encounter: Payer: Self-pay | Admitting: Family

## 2022-04-15 ENCOUNTER — Other Ambulatory Visit: Payer: Self-pay | Admitting: Family

## 2022-04-15 DIAGNOSIS — D500 Iron deficiency anemia secondary to blood loss (chronic): Secondary | ICD-10-CM

## 2022-04-15 DIAGNOSIS — D649 Anemia, unspecified: Secondary | ICD-10-CM | POA: Insufficient documentation

## 2022-04-15 NOTE — Progress Notes (Signed)
Can we ask pt if she is still seeing GI? I do not see any notes so may be outside location for GI.  ?Her anemia has went down even more since last time we checked one month ago, I want to make sure she is not still losing blood from her GI tract. Also, can you confirm she is taking her iron daily?  ? ?I am also referring her to hematology to evaluate further for worsening anemia.  ?Is pt having any sob?  Chest pain? Worsening fatigue? Or noticing any blood in stool?

## 2022-04-15 NOTE — Progress Notes (Signed)
Can we add iron and ferritin? Added as a future order if we are able to add. ?ICD - d64.9

## 2022-04-16 ENCOUNTER — Other Ambulatory Visit: Payer: 59

## 2022-04-18 ENCOUNTER — Inpatient Hospital Stay: Payer: 59 | Attending: Oncology | Admitting: Oncology

## 2022-04-18 ENCOUNTER — Inpatient Hospital Stay: Payer: 59

## 2022-06-08 ENCOUNTER — Other Ambulatory Visit: Payer: Self-pay | Admitting: Family

## 2022-06-08 DIAGNOSIS — I10 Essential (primary) hypertension: Secondary | ICD-10-CM

## 2022-12-14 ENCOUNTER — Other Ambulatory Visit: Payer: Self-pay | Admitting: Family

## 2022-12-14 DIAGNOSIS — I10 Essential (primary) hypertension: Secondary | ICD-10-CM

## 2023-02-12 ENCOUNTER — Encounter (HOSPITAL_BASED_OUTPATIENT_CLINIC_OR_DEPARTMENT_OTHER): Payer: Self-pay | Admitting: Emergency Medicine

## 2023-02-12 ENCOUNTER — Other Ambulatory Visit: Payer: Self-pay

## 2023-02-12 ENCOUNTER — Emergency Department (HOSPITAL_BASED_OUTPATIENT_CLINIC_OR_DEPARTMENT_OTHER)
Admission: EM | Admit: 2023-02-12 | Discharge: 2023-02-12 | Disposition: A | Discharge status: Home / Self Care | Payer: BC Managed Care – PPO | Attending: Emergency Medicine | Admitting: Emergency Medicine

## 2023-02-12 DIAGNOSIS — E876 Hypokalemia: Secondary | ICD-10-CM | POA: Diagnosis not present

## 2023-02-12 DIAGNOSIS — R197 Diarrhea, unspecified: Secondary | ICD-10-CM | POA: Insufficient documentation

## 2023-02-12 DIAGNOSIS — R112 Nausea with vomiting, unspecified: Secondary | ICD-10-CM

## 2023-02-12 DIAGNOSIS — R1033 Periumbilical pain: Secondary | ICD-10-CM | POA: Insufficient documentation

## 2023-02-12 LAB — CBC WITH DIFFERENTIAL/PLATELET
Abs Immature Granulocytes: 0.01 10*3/uL (ref 0.00–0.07)
Basophils Absolute: 0 10*3/uL (ref 0.0–0.1)
Basophils Relative: 0 %
Eosinophils Absolute: 0.1 10*3/uL (ref 0.0–0.5)
Eosinophils Relative: 1 %
HCT: 36.7 % (ref 36.0–46.0)
Hemoglobin: 11.7 g/dL — ABNORMAL LOW (ref 12.0–15.0)
Immature Granulocytes: 0 %
Lymphocytes Relative: 19 %
Lymphs Abs: 0.9 10*3/uL (ref 0.7–4.0)
MCH: 23.8 pg — ABNORMAL LOW (ref 26.0–34.0)
MCHC: 31.9 g/dL (ref 30.0–36.0)
MCV: 74.6 fL — ABNORMAL LOW (ref 80.0–100.0)
Monocytes Absolute: 0.3 10*3/uL (ref 0.1–1.0)
Monocytes Relative: 7 %
Neutro Abs: 3.4 10*3/uL (ref 1.7–7.7)
Neutrophils Relative %: 73 %
Platelets: 252 10*3/uL (ref 150–400)
RBC: 4.92 MIL/uL (ref 3.87–5.11)
RDW: 16.4 % — ABNORMAL HIGH (ref 11.5–15.5)
WBC: 4.6 10*3/uL (ref 4.0–10.5)
nRBC: 0 % (ref 0.0–0.2)

## 2023-02-12 LAB — COMPREHENSIVE METABOLIC PANEL
ALT: 22 U/L (ref 0–44)
AST: 19 U/L (ref 15–41)
Albumin: 4.1 g/dL (ref 3.5–5.0)
Alkaline Phosphatase: 75 U/L (ref 38–126)
Anion gap: 10 (ref 5–15)
BUN: 10 mg/dL (ref 6–20)
CO2: 27 mmol/L (ref 22–32)
Calcium: 9.3 mg/dL (ref 8.9–10.3)
Chloride: 103 mmol/L (ref 98–111)
Creatinine, Ser: 1.06 mg/dL — ABNORMAL HIGH (ref 0.44–1.00)
GFR, Estimated: >60 mL/min (ref >60)
Glucose, Bld: 87 mg/dL (ref 70–99)
Potassium: 3.3 mmol/L — ABNORMAL LOW (ref 3.5–5.1)
Sodium: 140 mmol/L (ref 135–145)
Total Bilirubin: 0.3 mg/dL (ref 0.3–1.2)
Total Protein: 7.9 g/dL (ref 6.5–8.1)

## 2023-02-12 LAB — URINALYSIS, ROUTINE W REFLEX MICROSCOPIC
Bilirubin Urine: NEGATIVE
Glucose, UA: NEGATIVE mg/dL
Hgb urine dipstick: NEGATIVE
Ketones, ur: NEGATIVE mg/dL
Leukocytes,Ua: NEGATIVE
Nitrite: NEGATIVE
Specific Gravity, Urine: 1.023 (ref 1.005–1.030)
pH: 6 (ref 5.0–8.0)

## 2023-02-12 LAB — PREGNANCY, URINE: Preg Test, Ur: NEGATIVE

## 2023-02-12 LAB — LIPASE, BLOOD: Lipase: <10 U/L — ABNORMAL LOW (ref 11–51)

## 2023-02-12 MED ORDER — KETOROLAC TROMETHAMINE 15 MG/ML IJ SOLN
15.0000 mg | Freq: Once | INTRAMUSCULAR | Status: AC
  Administered 2023-02-12: 15 mg via INTRAVENOUS
  Filled 2023-02-12: qty 1

## 2023-02-12 MED ORDER — ONDANSETRON 4 MG PO TBDP: 4.0000 mg | ORAL_TABLET | Freq: Three times a day (TID) | ORAL | 0 refills | Status: AC | PRN

## 2023-02-12 MED ORDER — SODIUM CHLORIDE 0.9 % IV BOLUS
1000.0000 mL | Freq: Once | INTRAVENOUS | Status: AC
  Administered 2023-02-12: 1000 mL via INTRAVENOUS

## 2023-02-12 MED ORDER — ONDANSETRON HCL 4 MG/2ML IJ SOLN
4.0000 mg | Freq: Once | INTRAMUSCULAR | Status: AC
  Administered 2023-02-12: 4 mg via INTRAVENOUS
  Filled 2023-02-12: qty 2

## 2023-02-12 NOTE — Discharge Instructions (Signed)
Please read and follow all provided instructions.  Your diagnoses today include:  1. Nausea vomiting and diarrhea     Tests performed today include: Blood cell counts and platelets Kidney and liver function tests Pancreas function test (called lipase) Urine test to look for infection A blood or urine test for pregnancy (women only) Vital signs. See below for your results today.   Medications prescribed:  Zofran (ondansetron) - for nausea and vomiting  Take any prescribed medications only as directed.  Home care instructions:  Follow any educational materials contained in this packet.  Follow-up instructions: Please follow-up with your primary care provider in the next 3 days for further evaluation of your symptoms.    Return instructions:  SEEK IMMEDIATE MEDICAL ATTENTION IF: The pain does not go away or becomes severe  A temperature above 101F develops  Repeated vomiting occurs (multiple episodes)  The pain becomes localized to portions of the abdomen. The right side could possibly be appendicitis. In an adult, the left lower portion of the abdomen could be colitis or diverticulitis.  Blood is being passed in stools or vomit (bright red or black tarry stools)  You develop chest pain, difficulty breathing, dizziness or fainting, or become confused, poorly responsive, or inconsolable (young children) If you have any other emergent concerns regarding your health  Additional Information: Abdominal (belly) pain can be caused by many things. Your caregiver performed an examination and possibly ordered blood/urine tests and imaging (CT scan, x-rays, ultrasound). Many cases can be observed and treated at home after initial evaluation in the emergency department. Even though you are being discharged home, abdominal pain can be unpredictable. Therefore, you need a repeated exam if your pain does not resolve, returns, or worsens. Most patients with abdominal pain don't have to be admitted  to the hospital or have surgery, but serious problems like appendicitis and gallbladder attacks can start out as nonspecific pain. Many abdominal conditions cannot be diagnosed in one visit, so follow-up evaluations are very important.  Your vital signs today were: BP (!) 151/80   Pulse 74   Temp 97.6 F (36.4 C) (Oral)   Resp 17   SpO2 100%  If your blood pressure (bp) was elevated above 135/85 this visit, please have this repeated by your doctor within one month. --------------

## 2023-02-12 NOTE — ED Triage Notes (Signed)
Pt reports positive COVID test on 02/03/23. Pt reports that today she began vomiting while at work. Pt stating that she was recently prescribed doxycycline for sinus infection.

## 2023-02-12 NOTE — ED Notes (Signed)
Pt verbalized understanding of d/c instructions, meds, and followup care. Denies questions. VSS, no distress noted. Steady gait to exit with all belongings. 

## 2023-02-12 NOTE — ED Provider Notes (Signed)
[facility] Provider Note   CSN: [id number] Arrival date & time: 02/12/23  1212     History  Chief Complaint  Patient presents with   Emesis    Penny Savage is a 47 y.o. female.  Patient presents to the emergency department today for evaluation of nausea, vomiting, diarrhea and abdominal pain.  Patient began feeling ill about 10 days ago.  She tested positive for coronavirus on 02/03/2023.  At that time she was having fevers and congestion.  Fevers have improved however she continues with a cough productive of green sputum.  She was seen by primary care who started her on doxycycline.  She first took this 2 days ago.  Today she awoke, feeling worse with onset of vomiting and diarrhea.  Vomiting nonbloody, nonbilious.  Diarrhea nonbloody.  She has had some abdominal pain that she relates to having vomited.  No urinary symptoms.  She reports ongoing menstrual period.         Home Medications Prior to Admission medications   Medication Sig Start Date End Date Taking? Authorizing Provider  doxycycline (VIBRAMYCIN) 100 MG capsule Take 100 mg by mouth 2 (two) times daily. 02/10/23  Yes [provider]  amLODipine (NORVASC) 5 MG tablet Take 5 mg by mouth daily.    [provider]  Ferrous Sulfate (IRON) 325 (65 Fe) MG TABS Take 1 tablet (325 mg total) by mouth every other day. 02/08/22   Richarda Osmond, MD  losartan (COZAAR) 100 MG tablet TAKE 1 TABLET BY MOUTH EVERY DAY 12/15/22   Pleas Koch, NP      Allergies    Fish allergy and Penicillins    Review of Systems   Review of Systems  Physical Exam Updated Vital Signs BP (!) 159/100 (BP Location: Right Arm)   Pulse 73   Temp 97.6 F (36.4 C) (Oral)   Resp 16   SpO2 100%  Physical Exam Vitals and nursing note reviewed.  Constitutional:      General: She is not in acute distress.    Appearance: She is well-developed.  HENT:     Head: Normocephalic and  atraumatic.     Right Ear: External ear normal.     Left Ear: External ear normal.     Nose: Nose normal.  Eyes:     Conjunctiva/sclera: Conjunctivae normal.  Cardiovascular:     Rate and Rhythm: Normal rate and regular rhythm.     Heart sounds: No murmur heard. Pulmonary:     Effort: No respiratory distress.     Breath sounds: No wheezing, rhonchi or rales.  Abdominal:     Palpations: Abdomen is soft.     Tenderness: There is abdominal tenderness. There is no guarding or rebound.     Comments: Minimal periumbilical tenderness to palpation.  Musculoskeletal:     Cervical back: Normal range of motion and neck supple.     Right lower leg: No edema.     Left lower leg: No edema.  Skin:    General: Skin is warm and dry.     Findings: No rash.  Neurological:     General: No focal deficit present.     Mental Status: She is alert. Mental status is at baseline.     Motor: No weakness.  Psychiatric:        Mood and Affect: Mood normal.     ED Results / Procedures / Treatments   Labs (all labs ordered are  listed, but only abnormal results are displayed) Labs Reviewed  CBC WITH DIFFERENTIAL/PLATELET - Abnormal; Notable for the following components:      Result Value   Hemoglobin 11.7 (*)    MCV 74.6 (*)    MCH 23.8 (*)    RDW 16.4 (*)    All other components within normal limits  COMPREHENSIVE METABOLIC PANEL - Abnormal; Notable for the following components:   Potassium 3.3 (*)    Creatinine, Ser 1.06 (*)    All other components within normal limits  LIPASE, BLOOD - Abnormal; Notable for the following components:   Lipase <10 (*)    All other components within normal limits  URINALYSIS, ROUTINE W REFLEX MICROSCOPIC - Abnormal; Notable for the following components:   Protein, ur TRACE (*)    All other components within normal limits  PREGNANCY, URINE    EKG None  Radiology No results found.  Procedures Procedures    Medications Ordered in ED Medications  sodium  chloride 0.9 % bolus 1,000 mL (0 mLs Intravenous Stopped 02/12/23 1512)  ketorolac (TORADOL) 15 MG/ML injection 15 mg (15 mg Intravenous Given 02/12/23 1333)  ondansetron (ZOFRAN) injection 4 mg (4 mg Intravenous Given 02/12/23 1333)    ED Course/ Medical Decision Making/ A&P    Patient seen and examined. History obtained directly from patient.   Labs/EKG: Ordered CBC, CMP, lipase, UA, pregnancy.  Imaging: None ordered  Medications/Fluids: Ordered: IV fluid bolus, IV Zofran, IV Toradol.  Most recent vital signs reviewed and are as follows: BP (!) 159/100 (BP Location: Right Arm)   Pulse 73   Temp 97.6 F (36.4 C) (Oral)   Resp 16   SpO2 100%   Initial impression: Nausea, vomiting, diarrhea, in setting of recent antibiotic use and COVID infection.     Notified by RN that patient was feeling better and is requesting discharge.  Reassessment performed. Patient appears stable.  She states that she is feeling better.  Labs personally reviewed and interpreted including: CBC with differential shows normal white blood cell count, hemoglobin minimally low at 11.7, microcytic, otherwise unremarkable; CMP mild hypokalemia 3.3 with creatinine minimally elevated at 1.06 otherwise unremarkable; lipase normal; UA without compelling signs of infection; pregnancy negative.  Reviewed pertinent lab work and imaging with patient at bedside. Questions answered.   Most current vital signs reviewed and are as follows: BP (!) 151/80   Pulse 74   Temp 97.6 F (36.4 C) (Oral)   Resp 17   SpO2 100%   Plan: Discharge to home.   Prescriptions written for: Zofran  Other home care instructions discussed: Criss Rosales diet with slow advancement as tolerated, avoidance of triggers.  Also discussed current use of doxycycline.  I am concerned that this could be exacerbating her GI symptoms.  She is taking this for productive cough and I do not feel clinically that she has a pneumonia.  Discussed that if she cannot  tolerate the antibiotic, it may be safe for her to discontinue and continue to treat symptoms.  ED return instructions discussed: The patient was urged to return to the Emergency Department immediately with worsening of current symptoms, worsening abdominal pain, persistent vomiting, blood noted in stools, fever, or any other concerns. The patient verbalized understanding.   Follow-up instructions discussed: Patient encouraged to follow-up with their PCP in 3 days.  Medical Decision Making Amount and/or Complexity of Data Reviewed Labs: ordered.  Risk Prescription drug management.   For this patient's complaint of abdominal pain, the following conditions were considered on the differential diagnosis: gastritis/PUD, enteritis/duodenitis, appendicitis, cholelithiasis/cholecystitis, cholangitis, pancreatitis, ruptured viscus, colitis, diverticulitis, small/large bowel obstruction, proctitis, cystitis, pyelonephritis, ureteral colic, aortic dissection, aortic aneurysm. In women, ectopic pregnancy, pelvic inflammatory disease, ovarian cysts, and tubo-ovarian abscess were also considered. Atypical chest etiologies were also considered including ACS, PE, and pneumonia.  The patient's vital signs, pertinent lab work and imaging were reviewed and interpreted as discussed in the ED course. Hospitalization was considered for further testing, treatments, or serial exams/observation. However as patient is well-appearing, has a stable exam, and reassuring studies today, I do not feel that they warrant admission at this time. This plan was discussed with the patient who verbalizes agreement and comfort with this plan and seems reliable and able to return to the Emergency Department with worsening or changing symptoms.          Final Clinical Impression(s) / ED Diagnoses Final diagnoses:  Nausea vomiting and diarrhea    Rx / DC Orders ED Discharge Orders           Ordered    ondansetron (ZOFRAN-ODT) 4 MG disintegrating tablet  Every 8 hours PRN        02/12/23 1536              Carlisle Cater, PA-C 02/12/23 1833    Sherwood Gambler, MD 02/14/23 [phone]

## 2023-02-13 ENCOUNTER — Telehealth: Payer: Self-pay

## 2023-02-13 NOTE — Transitions of Care (Post Inpatient/ED Visit) (Signed)
   02/13/2023  Name: Penny Savage MRN: [id number] DOB: 04-24-1976  Today's TOC FU Call Status: Today's TOC FU Call Status:: Successful TOC FU Call Competed TOC FU Call Complete Date: 02/13/23  Transition Care Management Follow-up Telephone Call Date of Discharge: 02/12/23 Discharge Facility: [facility] ([facility]) Type of Discharge: Emergency Department Reason for ED Visit: Other: (Nausea vomiting and diarrhea) How have you been since you were released from the hospital?: Better Any questions or concerns?: No  Items Reviewed: Did you receive and understand the discharge instructions provided?: Yes Medications obtained and verified?: Yes (Medications Reviewed) Any new allergies since your discharge?: No Dietary orders reviewed?: NA Do you have support at home?: Yes  Home Care and Equipment/Supplies: [facility] Ordered?: NA Any new equipment or medical supplies ordered?: NA  Functional Questionnaire: Do you need assistance with bathing/showering or dressing?: No Do you need assistance with meal preparation?: No Do you need assistance with eating?: No Do you have difficulty maintaining continence: No Do you need assistance with getting out of bed/getting out of a chair/moving?: No Do you have difficulty managing or taking your medications?: No  Folllow up appointments reviewed: PCP Follow-up appointment confirmed?: No (refused) MD Provider Line Number:[phone] Given: Yes [facility] Follow-up appointment confirmed?: NA Do you need transportation to your follow-up appointment?: No Do you understand care options if your condition(s) worsen?: Yes-patient verbalized understanding    [place] LPN [facility] Direct Dial [phone]

## 2023-03-17 ENCOUNTER — Other Ambulatory Visit: Payer: Self-pay | Admitting: Primary Care

## 2023-03-17 ENCOUNTER — Other Ambulatory Visit: Payer: Self-pay | Admitting: Family

## 2023-03-17 DIAGNOSIS — I10 Essential (primary) hypertension: Secondary | ICD-10-CM
